# Patient Record
Sex: Female | Born: 1995 | Race: Black or African American | Hispanic: No | Marital: Single | Smoking: Never smoker
Health system: Southern US, Community
[De-identification: ages and names within clinical notes are randomized; demographics above are authoritative.]

## PROBLEM LIST (undated history)

## (undated) DIAGNOSIS — J45909 Unspecified asthma, uncomplicated: Secondary | ICD-10-CM

---
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
## 2017-03-21 ENCOUNTER — Encounter (HOSPITAL_COMMUNITY): Payer: Self-pay | Admitting: Emergency Medicine

## 2017-03-21 ENCOUNTER — Other Ambulatory Visit: Payer: Self-pay

## 2017-03-21 ENCOUNTER — Emergency Department (HOSPITAL_COMMUNITY)
Admission: EM | Admit: 2017-03-21 | Discharge: 2017-03-21 | Disposition: A | Discharge status: Home / Self Care | Payer: Self-pay | Attending: Physician Assistant | Admitting: Physician Assistant

## 2017-03-21 DIAGNOSIS — M79652 Pain in left thigh: Secondary | ICD-10-CM | POA: Insufficient documentation

## 2017-03-21 DIAGNOSIS — R51 Headache: Secondary | ICD-10-CM | POA: Insufficient documentation

## 2017-03-21 DIAGNOSIS — J45909 Unspecified asthma, uncomplicated: Secondary | ICD-10-CM | POA: Insufficient documentation

## 2017-03-21 MED ORDER — METHOCARBAMOL 500 MG PO TABS: 500.0000 mg | ORAL_TABLET | Freq: Every evening | ORAL | 0 refills | Status: DC | PRN

## 2017-03-21 MED ORDER — NAPROXEN 500 MG PO TABS: 500.0000 mg | ORAL_TABLET | Freq: Two times a day (BID) | ORAL | 0 refills | Status: DC
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
## 2019-02-28 ENCOUNTER — Encounter (HOSPITAL_COMMUNITY): Payer: Self-pay

## 2019-02-28 ENCOUNTER — Emergency Department (HOSPITAL_COMMUNITY)
Admission: EM | Admit: 2019-02-28 | Discharge: 2019-02-28 | Disposition: A | Discharge status: Home / Self Care | Payer: Self-pay | Attending: Emergency Medicine | Admitting: Emergency Medicine

## 2019-02-28 ENCOUNTER — Other Ambulatory Visit: Payer: Self-pay

## 2019-02-28 ENCOUNTER — Emergency Department (HOSPITAL_COMMUNITY): Payer: Self-pay

## 2019-02-28 DIAGNOSIS — Z791 Long term (current) use of non-steroidal anti-inflammatories (NSAID): Secondary | ICD-10-CM | POA: Insufficient documentation

## 2019-02-28 DIAGNOSIS — J45909 Unspecified asthma, uncomplicated: Secondary | ICD-10-CM | POA: Insufficient documentation

## 2019-02-28 DIAGNOSIS — Z793 Long term (current) use of hormonal contraceptives: Secondary | ICD-10-CM | POA: Insufficient documentation

## 2019-02-28 DIAGNOSIS — Z20822 Contact with and (suspected) exposure to covid-19: Secondary | ICD-10-CM

## 2019-02-28 DIAGNOSIS — R509 Fever, unspecified: Secondary | ICD-10-CM | POA: Insufficient documentation

## 2019-02-28 DIAGNOSIS — Z79899 Other long term (current) drug therapy: Secondary | ICD-10-CM | POA: Insufficient documentation

## 2019-02-28 DIAGNOSIS — Z20828 Contact with and (suspected) exposure to other viral communicable diseases: Secondary | ICD-10-CM | POA: Insufficient documentation

## 2019-02-28 LAB — COMPREHENSIVE METABOLIC PANEL
ALT: 67 U/L — ABNORMAL HIGH (ref 0–44)
AST: 132 U/L — ABNORMAL HIGH (ref 15–41)
Albumin: 3.8 g/dL (ref 3.5–5.0)
Alkaline Phosphatase: 88 U/L (ref 38–126)
Anion gap: 10 (ref 5–15)
BUN: 10 mg/dL (ref 6–20)
CO2: 23 mmol/L (ref 22–32)
Calcium: 8.3 mg/dL — ABNORMAL LOW (ref 8.9–10.3)
Chloride: 97 mmol/L — ABNORMAL LOW (ref 98–111)
Creatinine, Ser: 0.73 mg/dL (ref 0.44–1.00)
GFR calc Af Amer: >60 mL/min (ref >60)
GFR calc non Af Amer: >60 mL/min (ref >60)
Glucose, Bld: 111 mg/dL — ABNORMAL HIGH (ref 70–99)
Potassium: 4 mmol/L (ref 3.5–5.1)
Sodium: 130 mmol/L — ABNORMAL LOW (ref 135–145)
Total Bilirubin: 1.5 mg/dL — ABNORMAL HIGH (ref 0.3–1.2)
Total Protein: 7.7 g/dL (ref 6.5–8.1)

## 2019-02-28 LAB — CBC WITH DIFFERENTIAL/PLATELET
Abs Immature Granulocytes: 0.02 10*3/uL (ref 0.00–0.07)
Basophils Absolute: 0.1 10*3/uL (ref 0.0–0.1)
Basophils Relative: 1 %
Eosinophils Absolute: 0 10*3/uL (ref 0.0–0.5)
Eosinophils Relative: 0 %
HCT: 41.6 % (ref 36.0–46.0)
Hemoglobin: 13.6 g/dL (ref 12.0–15.0)
Immature Granulocytes: 0 %
Lymphocytes Relative: 17 %
Lymphs Abs: 1.1 10*3/uL (ref 0.7–4.0)
MCH: 29.4 pg (ref 26.0–34.0)
MCHC: 32.7 g/dL (ref 30.0–36.0)
MCV: 89.8 fL (ref 80.0–100.0)
Monocytes Absolute: 0.5 10*3/uL (ref 0.1–1.0)
Monocytes Relative: 7 %
Neutro Abs: 4.9 10*3/uL (ref 1.7–7.7)
Neutrophils Relative %: 75 %
Platelets: 213 10*3/uL (ref 150–400)
RBC: 4.63 MIL/uL (ref 3.87–5.11)
RDW: 13.9 % (ref 11.5–15.5)
WBC: 6.6 10*3/uL (ref 4.0–10.5)
nRBC: 0 % (ref 0.0–0.2)

## 2019-02-28 LAB — URINALYSIS, ROUTINE W REFLEX MICROSCOPIC
Bilirubin Urine: NEGATIVE
Glucose, UA: NEGATIVE mg/dL
Hgb urine dipstick: NEGATIVE
Ketones, ur: NEGATIVE mg/dL
Nitrite: NEGATIVE
Protein, ur: 30 mg/dL — AB
Specific Gravity, Urine: 1.024 (ref 1.005–1.030)
pH: 5 (ref 5.0–8.0)

## 2019-02-28 MED ORDER — ONDANSETRON 4 MG PO TBDP: 4.0000 mg | ORAL_TABLET | Freq: Three times a day (TID) | ORAL | 0 refills | Status: DC | PRN

## 2019-02-28 MED ORDER — CEPHALEXIN 500 MG PO CAPS: 500.0000 mg | ORAL_CAPSULE | Freq: Three times a day (TID) | ORAL | 0 refills | Status: DC

## 2019-02-28 MED ORDER — ACETAMINOPHEN 500 MG PO TABS
1000.0000 mg | ORAL_TABLET | Freq: Once | ORAL | Status: AC
  Administered 2019-02-28: 1000 mg via ORAL
  Filled 2019-02-28: qty 2

## 2019-02-28 MED ORDER — CEPHALEXIN 500 MG PO CAPS
500.0000 mg | ORAL_CAPSULE | Freq: Once | ORAL | Status: DC
  Filled 2019-02-28: qty 1

## 2019-02-28 NOTE — ED Triage Notes (Signed)
Pt reports a headache (she describes it as feeling like a hangover) and fatigue x4 days. Denies any sick contacts. States that headache is worse when she looks up.

## 2019-02-28 NOTE — ED Provider Notes (Signed)
[facility]-EMERGENCY DEPT Provider Note  CSN: [id number] Arrival date & time: 02/28/19 2458  Chief Complaint(s) Chills  HPI Daisy Goodman is a 23 y.o. female who presents to the emergency department with generalized fatigue, generalized headache, neck pain.  Patient reports that she has had generalized mild headache for 3 to 4 days which has improved with over-the-counter migraine headache.  Patient also reported right-sided neck pain worse with range of motion and looking up.  She denies any nuchal rigidity.  Reports pain improvement with the heat pack.  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  Patient actually presented today after waking up at 2 AM with chills and shaking.  She did not realize she had a fever.  She denies any nausea vomiting.  No chest pain or shortness of breath.  No coughing or congestion.  No abdominal pain.  No diarrhea.  No urinary symptoms.  The history is provided by the patient.    Past Medical History Past Medical History:  Diagnosis Date  . Asthma    Patient Active Problem List   Diagnosis Date Noted  . Rhinitis, allergic 02/26/2015  . Dysmenorrhea 02/26/2015  . Severe obesity (BMI >= 40) (HCC) 09/17/2013  . Asthma, chronic 09/17/2013   Home Medication(s) Prior to Admission medications   Medication Sig Start Date End Date Taking? Authorizing Provider  albuterol (PROVENTIL HFA;VENTOLIN HFA) 108 (90 Base) MCG/ACT inhaler Inhale 2 puffs into the lungs every 6 (six) hours as needed for wheezing. Use with spacer 11/27/15   Kalman Jewels, MD  cephALEXin (KEFLEX) 500 MG capsule Take 1 capsule (500 mg total) by mouth 3 (three) times daily for 5 days. 02/28/19 03/05/19  Nira Conn, MD  cetirizine (ZYRTEC) 10 MG tablet Take 1 tablet (10 mg total) by mouth daily. As needed for allergy symptoms 11/27/15   Kalman Jewels, MD    Etonogestrel [facility]) Inject into the skin.    [provider]  methocarbamol (ROBAXIN) 500 MG tablet Take 1 tablet (500 mg total) by mouth at bedtime as needed for muscle spasms. 03/21/17   Mathews Robinsons B, PA-C  naproxen (NAPROSYN) 500 MG tablet Take 1 tablet (500 mg total) by mouth 2 (two) times daily with a meal. 03/21/17   Mathews Robinsons B, PA-C  ondansetron (ZOFRAN ODT) 4 MG disintegrating tablet Take 1 tablet (4 mg total) by mouth every 8 (eight) hours as needed for up to 3 days for nausea or vomiting. 02/28/19 03/03/19  Rosaleen Mazer, Amadeo Garnet, MD                                                                                                                                    Past Surgical History History reviewed. No pertinent surgical history. Family History Family History  Problem Relation Age of Onset  .  Hypertension Other   . Heart attack Other     Social History Social History   Tobacco Use  . Smoking status: Never Smoker  . Smokeless tobacco: Never Used  Substance Use Topics  . Alcohol use: No  . Drug use: No   Allergies Patient has no known allergies.  Review of Systems Review of Systems All other systems are reviewed and are negative for acute change except as noted in the HPI  Physical Exam Vital Signs  I have reviewed the triage vital signs BP 108/62 (BP Location: Left Arm)   Pulse (!) 123   Temp (!) 103 F (39.4 C) (Oral)   Resp 18   Ht 6' (1.829 m)   Wt 136.1 kg   SpO2 98%   BMI 40.69 kg/m   Physical Exam Vitals reviewed.  Constitutional:      General: She is not in acute distress.    Appearance: She is well-developed. She is obese. She is not diaphoretic.  HENT:     Head: Normocephalic and atraumatic.     Nose: Nose normal.  Eyes:     General: No scleral icterus.       Right eye: No discharge.        Left eye: No discharge.     Conjunctiva/sclera: Conjunctivae normal.     Pupils: Pupils are equal, round, and reactive  to light.  Neck:     Meningeal: Brudzinski's sign and Kernig's sign absent.   Cardiovascular:     Rate and Rhythm: Normal rate and regular rhythm.     Heart sounds: No murmur. No friction rub. No gallop.   Pulmonary:     Effort: Pulmonary effort is normal. No respiratory distress.     Breath sounds: Normal breath sounds. No stridor.  Abdominal:     General: There is no distension.     Palpations: Abdomen is soft.     Tenderness: There is no abdominal tenderness.  Musculoskeletal:     Cervical back: Normal range of motion and neck supple. Tenderness present. Muscular tenderness present. No spinous process tenderness.     Thoracic back: Tenderness present.       Back:  Skin:    General: Skin is warm and dry.     Findings: No erythema or rash.  Neurological:     Mental Status: She is alert and oriented to person, place, and time.     ED Results and Treatments Labs (all labs ordered are listed, but only abnormal results are displayed) Labs Reviewed  COMPREHENSIVE METABOLIC PANEL - Abnormal; Notable for the following components:      Result Value   Sodium 130 (*)    Chloride 97 (*)    Glucose, Bld 111 (*)    Calcium 8.3 (*)    AST 132 (*)    ALT 67 (*)    Total Bilirubin 1.5 (*)    All other components within normal limits  URINALYSIS, ROUTINE W REFLEX MICROSCOPIC - Abnormal; Notable for the following components:   Color, Urine AMBER (*)    APPearance HAZY (*)    Protein, ur 30 (*)    Leukocytes,Ua MODERATE (*)    Bacteria, UA FEW (*)    All other components within normal limits  NOVEL CORONAVIRUS, NAA (HOSP ORDER, SEND-OUT TO REF LAB; TAT 18-24 HRS)  URINE CULTURE  CBC WITH DIFFERENTIAL/PLATELET  EKG  EKG Interpretation  Date/Time:    Ventricular Rate:    PR Interval:    QRS Duration:   QT Interval:    QTC Calculation:   R Axis:     Text  Interpretation:        Radiology CXR Port 1 View - chest pain  Result Date: 02/28/2019 CLINICAL DATA:  Fever and body aches. EXAM: PORTABLE CHEST 1 VIEW COMPARISON:  None. FINDINGS: The cardiomediastinal contours are normal. Upper normal heart size, particularly for age. Minimal streaky bibasilar opacities. Pulmonary vasculature is normal. No pleural effusion or pneumothorax. No acute osseous abnormalities are seen. Soft tissue attenuation from habitus limits assessment. IMPRESSION: 1. Minimal streaky bibasilar opacities, favor atelectasis over pneumonia 2. Upper normal heart size, particularly for age. Electronically Signed   By: Keith Rake M.D.   On: 02/28/2019 04:55    Pertinent labs & imaging results that were available during my care of the patient were reviewed by me and considered in my medical decision making (see chart for details).  Medications Ordered in ED Medications  cephALEXin (KEFLEX) capsule 500 mg (has no administration in time range)  acetaminophen (TYLENOL) tablet 1,000 mg (1,000 mg Oral Given 02/28/19 0421)                                                                                                                                    Procedures Procedures  (including critical care time)  Medical Decision Making / ED Course I have reviewed the nursing notes for this encounter and the patient's prior records (if available in EHR or on provided paperwork).   Daisy Goodman was evaluated in Emergency Department on 02/28/2019 for the symptoms described in the history of present illness. She was evaluated in the context of the global COVID-19 pandemic, which necessitated consideration that the patient might be at risk for infection with the SARS-CoV-2 virus that causes COVID-19. Institutional protocols and algorithms that pertain to the evaluation of patients at risk for COVID-19 are in a state of rapid change based on information released by regulatory bodies  including the CDC and federal and state organizations. These policies and algorithms were followed during the patient's care in the ED.  Patient presents with headache and myalgia for 3 days. adequate oral hydration. Rest of history as above.  Patient appears well. No signs of toxicity, patient is interactive. No hypoxia, tachypnea or other signs of respiratory distress. No sign of clinical dehydration.  Rest of exam as above.  CXR w/o PNA. Labs with mild transaminitis - if COVID + likely related to this.  UA with possible infection. Culture sent. Short course of Abx  No evidence suggestive of pharyngitis, AOM, PNA, or meningitis.    Discussed symptomatic treatment with the patient and they will follow closely with their PCP.        Final Clinical Impression(s) / ED Diagnoses Final diagnoses:  Fever  Suspected  COVID-19 virus infection    The patient appears reasonably screened and/or stabilized for discharge and I doubt any other medical condition or other EMC requiring further screening, evaluation, or treatment in the ED at this [facility].  Disposition: Discharge  Condition: Good  I have discussed the results, Dx and Tx plan with the patient who expressed understanding and agree(s) with the plan. Discharge instructions discussed at great length. The patient was given strict return precautions who verbalized understanding of the instructions. No further questions at time of discharge.    ED Discharge Orders         Ordered    cephALEXin (KEFLEX) 500 MG capsule  3 times daily     02/28/19 0727    ondansetron (ZOFRAN ODT) 4 MG disintegrating tablet  Every 8 hours PRN     02/28/19 0728           Follow Up: Norm SaltVanstory, Ashley N, PA [address] [place] [address] [phone]  Call  As needed      This chart was dictated using voice recognition software.  Despite best efforts to proofread,  errors can occur which can change the  documentation meaning.   Nira Connardama, Adelise Buswell Eduardo, MD 02/28/19 [phone]

## 2019-03-01 LAB — NOVEL CORONAVIRUS, NAA (HOSP ORDER, SEND-OUT TO REF LAB; TAT 18-24 HRS): SARS-CoV-2, NAA: NOT DETECTED

## 2019-03-01 LAB — URINE CULTURE

## 2019-03-02 ENCOUNTER — Emergency Department (HOSPITAL_COMMUNITY): Payer: Self-pay

## 2019-03-02 ENCOUNTER — Other Ambulatory Visit: Payer: Self-pay

## 2019-03-02 ENCOUNTER — Encounter (HOSPITAL_COMMUNITY): Payer: Self-pay

## 2019-03-02 ENCOUNTER — Inpatient Hospital Stay (HOSPITAL_COMMUNITY)
Admission: EM | Admit: 2019-03-02 | Discharge: 2019-03-09 | DRG: 866 | Disposition: A | Discharge status: Home / Self Care | Payer: Self-pay | Attending: Internal Medicine | Admitting: Internal Medicine

## 2019-03-02 DIAGNOSIS — B349 Viral infection, unspecified: Principal | ICD-10-CM | POA: Diagnosis present

## 2019-03-02 DIAGNOSIS — Z20828 Contact with and (suspected) exposure to other viral communicable diseases: Secondary | ICD-10-CM | POA: Diagnosis present

## 2019-03-02 DIAGNOSIS — R519 Headache, unspecified: Secondary | ICD-10-CM | POA: Diagnosis present

## 2019-03-02 DIAGNOSIS — K760 Fatty (change of) liver, not elsewhere classified: Secondary | ICD-10-CM | POA: Diagnosis present

## 2019-03-02 DIAGNOSIS — R748 Abnormal levels of other serum enzymes: Secondary | ICD-10-CM | POA: Diagnosis present

## 2019-03-02 DIAGNOSIS — R112 Nausea with vomiting, unspecified: Secondary | ICD-10-CM

## 2019-03-02 DIAGNOSIS — R509 Fever, unspecified: Secondary | ICD-10-CM | POA: Diagnosis present

## 2019-03-02 DIAGNOSIS — R7401 Elevation of levels of liver transaminase levels: Secondary | ICD-10-CM

## 2019-03-02 DIAGNOSIS — N390 Urinary tract infection, site not specified: Secondary | ICD-10-CM | POA: Diagnosis present

## 2019-03-02 DIAGNOSIS — Z6841 Body Mass Index (BMI) 40.0 and over, adult: Secondary | ICD-10-CM

## 2019-03-02 DIAGNOSIS — G971 Other reaction to spinal and lumbar puncture: Secondary | ICD-10-CM | POA: Diagnosis not present

## 2019-03-02 LAB — CBC WITH DIFFERENTIAL/PLATELET
Abs Immature Granulocytes: 0.03 10*3/uL (ref 0.00–0.07)
Basophils Absolute: 0.1 10*3/uL (ref 0.0–0.1)
Basophils Relative: 1 %
Eosinophils Absolute: 0 10*3/uL (ref 0.0–0.5)
Eosinophils Relative: 0 %
HCT: 40.9 % (ref 36.0–46.0)
Hemoglobin: 13 g/dL (ref 12.0–15.0)
Immature Granulocytes: 1 %
Lymphocytes Relative: 34 %
Lymphs Abs: 2 10*3/uL (ref 0.7–4.0)
MCH: 28.6 pg (ref 26.0–34.0)
MCHC: 31.8 g/dL (ref 30.0–36.0)
MCV: 89.9 fL (ref 80.0–100.0)
Monocytes Absolute: 0.4 10*3/uL (ref 0.1–1.0)
Monocytes Relative: 7 %
Neutro Abs: 3.4 10*3/uL (ref 1.7–7.7)
Neutrophils Relative %: 57 %
Platelets: 181 10*3/uL (ref 150–400)
RBC: 4.55 MIL/uL (ref 3.87–5.11)
RDW: 14.3 % (ref 11.5–15.5)
WBC: 5.8 10*3/uL (ref 4.0–10.5)
nRBC: 0 % (ref 0.0–0.2)

## 2019-03-02 LAB — URINALYSIS, ROUTINE W REFLEX MICROSCOPIC
Glucose, UA: NEGATIVE mg/dL
Hgb urine dipstick: NEGATIVE
Ketones, ur: NEGATIVE mg/dL
Nitrite: NEGATIVE
Protein, ur: 100 mg/dL — AB
Specific Gravity, Urine: 1.026 (ref 1.005–1.030)
pH: 5 (ref 5.0–8.0)

## 2019-03-02 LAB — COMPREHENSIVE METABOLIC PANEL
ALT: 178 U/L — ABNORMAL HIGH (ref 0–44)
AST: 250 U/L — ABNORMAL HIGH (ref 15–41)
Albumin: 3.4 g/dL — ABNORMAL LOW (ref 3.5–5.0)
Alkaline Phosphatase: 213 U/L — ABNORMAL HIGH (ref 38–126)
Anion gap: 11 (ref 5–15)
BUN: 5 mg/dL — ABNORMAL LOW (ref 6–20)
CO2: 23 mmol/L (ref 22–32)
Calcium: 8.7 mg/dL — ABNORMAL LOW (ref 8.9–10.3)
Chloride: 99 mmol/L (ref 98–111)
Creatinine, Ser: 0.7 mg/dL (ref 0.44–1.00)
GFR calc Af Amer: >60 mL/min (ref >60)
GFR calc non Af Amer: >60 mL/min (ref >60)
Glucose, Bld: 105 mg/dL — ABNORMAL HIGH (ref 70–99)
Potassium: 3.6 mmol/L (ref 3.5–5.1)
Sodium: 133 mmol/L — ABNORMAL LOW (ref 135–145)
Total Bilirubin: 4.3 mg/dL — ABNORMAL HIGH (ref 0.3–1.2)
Total Protein: 7.7 g/dL (ref 6.5–8.1)

## 2019-03-02 LAB — RESPIRATORY PANEL BY RT PCR (FLU A&B, COVID)
Influenza A by PCR: NEGATIVE
Influenza B by PCR: NEGATIVE
SARS Coronavirus 2 by RT PCR: NEGATIVE

## 2019-03-02 LAB — LIPASE, BLOOD: Lipase: 30 U/L (ref 11–51)

## 2019-03-02 LAB — LACTIC ACID, PLASMA
Lactic Acid, Venous: 1.1 mmol/L (ref 0.5–1.9)
Lactic Acid, Venous: 0.9 mmol/L (ref 0.5–1.9)

## 2019-03-02 LAB — PREGNANCY, URINE: Preg Test, Ur: NEGATIVE

## 2019-03-02 LAB — ACETAMINOPHEN LEVEL: Acetaminophen (Tylenol), Serum: 15 ug/mL (ref 10–30)

## 2019-03-02 LAB — MONONUCLEOSIS SCREEN: Mono Screen: NEGATIVE

## 2019-03-02 MED ORDER — LIDOCAINE HCL 2 % IJ SOLN
10.0000 mL | Freq: Once | INTRAMUSCULAR | Status: AC
  Administered 2019-03-02: 200 mg via INTRADERMAL
  Filled 2019-03-02: qty 20

## 2019-03-02 MED ORDER — ONDANSETRON HCL 4 MG/2ML IJ SOLN
4.0000 mg | Freq: Once | INTRAMUSCULAR | Status: AC
  Administered 2019-03-02: 4 mg via INTRAVENOUS
  Filled 2019-03-02: qty 2

## 2019-03-02 MED ORDER — IOHEXOL 300 MG/ML  SOLN
100.0000 mL | Freq: Once | INTRAMUSCULAR | Status: AC | PRN
  Administered 2019-03-02: 100 mL via INTRAVENOUS

## 2019-03-02 MED ORDER — VANCOMYCIN HCL IN DEXTROSE 1-5 GM/200ML-% IV SOLN
1000.0000 mg | Freq: Once | INTRAVENOUS | Status: AC
  Administered 2019-03-02: 1000 mg via INTRAVENOUS
  Filled 2019-03-02: qty 200

## 2019-03-02 MED ORDER — MORPHINE SULFATE (PF) 4 MG/ML IV SOLN
4.0000 mg | Freq: Once | INTRAVENOUS | Status: AC
  Administered 2019-03-02: 4 mg via INTRAVENOUS
  Filled 2019-03-02: qty 1

## 2019-03-02 MED ORDER — SODIUM CHLORIDE (PF) 0.9 % IJ SOLN
INTRAMUSCULAR | Status: AC
  Filled 2019-03-02: qty 50

## 2019-03-02 MED ORDER — SODIUM CHLORIDE 0.9 % IV BOLUS
1000.0000 mL | Freq: Once | INTRAVENOUS | Status: AC
  Administered 2019-03-02: 1000 mL via INTRAVENOUS

## 2019-03-02 MED ORDER — ACETAMINOPHEN 500 MG PO TABS
1000.0000 mg | ORAL_TABLET | Freq: Once | ORAL | Status: AC
  Administered 2019-03-02: 19:00:00 1000 mg via ORAL
  Filled 2019-03-02: qty 2

## 2019-03-02 MED ORDER — SODIUM CHLORIDE 0.9 % IV SOLN
2.0000 g | Freq: Once | INTRAVENOUS | Status: AC
  Administered 2019-03-02: 2 g via INTRAVENOUS
  Filled 2019-03-02: qty 20

## 2019-03-02 NOTE — ED Triage Notes (Signed)
Pt reports she has had a headache for about a week now. Pt was seen here 2 days ago for same. Pt was diagnosed with a UTI and given antibiotics. Pt is febrile in triage. Pt took tylenol around 2pm.

## 2019-03-02 NOTE — ED Provider Notes (Signed)
Medical screening examination/treatment/procedure(s) were conducted as a shared visit with non-physician practitioner(s) and myself.  I personally evaluated the patient during the encounter.    23 year old female who presents with mild frontal headache, neck pain x2 to 4 days.  Seen here for similar symptoms 2 days ago and had negative Covid test work-up at that time.  Denies any photophobia.  Some nausea but no vomiting.  On exam she complains of neck discomfort with movement of her neck to the sides.  No nuchal rigidity.  Neurological exam is stable.  Will check patient for flu as well as Covid again.  Have offered patient LP which she is considering.   Lacretia Leigh, MD 03/02/19 [phone]

## 2019-03-02 NOTE — ED Provider Notes (Signed)
[facility] DEPT Provider Note   CSN: [id number] Arrival date & time: 03/02/19  1658     History Chief Complaint  Patient presents with  . Fever  . Headache    Daisy Goodman is a 23 y.o. female.  The history is provided by the patient. No language interpreter was used.  Fever Associated symptoms: headaches   Headache Associated symptoms: fever        23 year old female with history of asthma presenting complaining of cold symptoms.  For the past week patient has had headache, fever, generalized fatigue, not feeling well.  She does not complain of any runny nose sneezing or coughing no nausea vomiting diarrhea no light sound sensitivity and no dysuria.  She does complain of body aches and describes her headache as an uncomfortable sensation in her head but does not feel like pain.  She was seen in the ED 2 days ago for her complaint.  Was felt the patient is not likely having meningitis and her chest x-ray did not show any signs of pneumonia at that time her labs did show some mild transaminitis, UA with possible infection and therefore patient was sent home with Keflex as well as Tylenol and rest.  Patient mention she has been taking medication but felt symptoms not improving prompting this ER visit.  Past Medical History:  Diagnosis Date  . Asthma     Patient Active Problem List   Diagnosis Date Noted  . Rhinitis, allergic 02/26/2015  . Dysmenorrhea 02/26/2015  . Severe obesity (BMI >= 40) ([place]) 09/17/2013  . Asthma, chronic 09/17/2013    History reviewed. No pertinent surgical history.   OB History   No obstetric history on file.     Family History  Problem Relation Age of Onset  . Hypertension Other   . Heart attack Other     Social History   Tobacco Use  . Smoking status: Never Smoker  . Smokeless tobacco: Never Used  Substance Use Topics  . Alcohol use: No  . Drug use: No    Home Medications Prior to Admission  medications   Medication Sig Start Date End Date Taking? Authorizing Provider  albuterol (PROVENTIL HFA;VENTOLIN HFA) 108 (90 Base) MCG/ACT inhaler Inhale 2 puffs into the lungs every 6 (six) hours as needed for wheezing. Use with spacer 11/27/15   Rae Lips, MD  cephALEXin (KEFLEX) 500 MG capsule Take 1 capsule (500 mg total) by mouth 3 (three) times daily for 5 days. 02/28/19 03/05/19  Fatima Blank, MD  cetirizine (ZYRTEC) 10 MG tablet Take 1 tablet (10 mg total) by mouth daily. As needed for allergy symptoms 11/27/15   Rae Lips, MD  Etonogestrel [facility]) Inject into the skin.    [provider]  methocarbamol (ROBAXIN) 500 MG tablet Take 1 tablet (500 mg total) by mouth at bedtime as needed for muscle spasms. 03/21/17   Avie Echevaria B, PA-C  naproxen (NAPROSYN) 500 MG tablet Take 1 tablet (500 mg total) by mouth 2 (two) times daily with a meal. 03/21/17   Avie Echevaria B, PA-C  ondansetron (ZOFRAN ODT) 4 MG disintegrating tablet Take 1 tablet (4 mg total) by mouth every 8 (eight) hours as needed for up to 3 days for nausea or vomiting. 02/28/19 03/03/19  Cardama, Grayce Sessions, MD    Allergies    Patient has no known allergies.  Review of Systems   Review of Systems  Constitutional: Positive for fever.  Neurological: Positive for headaches.  All other systems reviewed and are negative.   Physical Exam Updated Vital Signs BP (!) 155/81 (BP Location: Right Arm)   Pulse (!) 125   Temp (!) 103.1 F (39.5 C) (Oral)   Resp 18   Ht 6' (1.829 m)   Wt 136 kg   LMP  (LMP Unknown)   SpO2 99%   BMI 40.66 kg/m   Physical Exam Vitals and nursing note reviewed.  Constitutional:      General: She is not in acute distress.    Appearance: She is well-developed.  HENT:     Head: Normocephalic and atraumatic.     Mouth/Throat:     Mouth: Mucous membranes are moist.  Eyes:     Extraocular Movements: Extraocular movements intact.      Conjunctiva/sclera: Conjunctivae normal.     Pupils: Pupils are equal, round, and reactive to light.  Cardiovascular:     Rate and Rhythm: Tachycardia present.     Heart sounds: Normal heart sounds.  Pulmonary:     Breath sounds: No wheezing, rhonchi or rales.  Abdominal:     Palpations: Abdomen is soft.     Tenderness: There is no abdominal tenderness.  Musculoskeletal:        General: Normal range of motion.     Cervical back: Neck supple. No rigidity.  Skin:    Findings: No rash.  Neurological:     Mental Status: She is alert and oriented to person, place, and time.     GCS: GCS eye subscore is 4. GCS verbal subscore is 5. GCS motor subscore is 6.     Cranial Nerves: No cranial nerve deficit.  Psychiatric:        Mood and Affect: Mood normal.     ED Results / Procedures / Treatments   Labs (all labs ordered are listed, but only abnormal results are displayed) Labs Reviewed  COMPREHENSIVE METABOLIC PANEL - Abnormal; Notable for the following components:      Result Value   Sodium 133 (*)    Glucose, Bld 105 (*)    BUN 5 (*)    Calcium 8.7 (*)    Albumin 3.4 (*)    AST 250 (*)    ALT 178 (*)    Alkaline Phosphatase 213 (*)    Total Bilirubin 4.3 (*)    All other components within normal limits  URINALYSIS, ROUTINE W REFLEX MICROSCOPIC - Abnormal; Notable for the following components:   Color, Urine AMBER (*)    Bilirubin Urine SMALL (*)    Protein, ur 100 (*)    Leukocytes,Ua MODERATE (*)    Bacteria, UA FEW (*)    All other components within normal limits  RESPIRATORY PANEL BY RT PCR (FLU A&B, COVID)  URINE CULTURE  CULTURE, BLOOD (ROUTINE X 2)  CULTURE, BLOOD (ROUTINE X 2)  CSF CULTURE  GRAM STAIN  CBC WITH DIFFERENTIAL/PLATELET  LIPASE, BLOOD  PREGNANCY, URINE  LACTIC ACID, PLASMA  LACTIC ACID, PLASMA  MONONUCLEOSIS SCREEN  ACETAMINOPHEN LEVEL  CSF CELL COUNT WITH DIFFERENTIAL  CSF CELL COUNT WITH DIFFERENTIAL  GLUCOSE, CSF  PROTEIN, CSF     EKG None  Radiology CT Head Wo Contrast  Result Date: 03/02/2019 CLINICAL DATA:  Headache EXAM: CT HEAD WITHOUT CONTRAST TECHNIQUE: Contiguous axial images were obtained from the base of the skull through the vertex without intravenous contrast. COMPARISON:  None. FINDINGS: Brain: There is no mass, hemorrhage or extra-axial collection. The size and configuration of the ventricles and extra-axial CSF  spaces are normal. The brain parenchyma is normal, without acute or chronic infarction. Vascular: No abnormal hyperdensity of the major intracranial arteries or dural venous sinuses. No intracranial atherosclerosis. Skull: The visualized skull base, calvarium and extracranial soft tissues are normal. Sinuses/Orbits: No fluid levels or advanced mucosal thickening of the visualized paranasal sinuses. No mastoid or middle ear effusion. The orbits are normal. IMPRESSION: Normal head CT. Electronically Signed   By: Ulyses Jarred M.D.   On: 03/02/2019 19:16   DG Chest Portable 1 View  Result Date: 03/02/2019 CLINICAL DATA:  Fever and headache EXAM: PORTABLE CHEST 1 VIEW COMPARISON:  February 28, 2019 FINDINGS: The heart size and mediastinal contours are mildly prominent as on prior exam. Both lungs are clear. The visualized skeletal structures are unremarkable. IMPRESSION: No active disease. Electronically Signed   By: Prudencio Pair M.D.   On: 03/02/2019 18:29    Procedures .Lumbar Puncture  Date/Time: 03/02/2019 9:43 PM Performed by: Domenic Moras, PA-C Authorized by: Domenic Moras, PA-C   Consent:    Consent obtained:  Verbal and written   Consent given by:  Patient   Risks discussed:  Bleeding, infection and repeat procedure   Alternatives discussed:  No treatment and referral Pre-procedure details:    Procedure purpose:  Diagnostic   Preparation: Patient was prepped and draped in usual sterile fashion   Anesthesia (see MAR for exact dosages):    Anesthesia method:  Local infiltration    Local anesthetic:  Lidocaine 2% w/o epi Procedure details:    Lumbar space:  L3-L4 interspace   Patient position:  Sitting   Needle gauge:  22   Needle type:  Spinal needle - Quincke tip   Needle length (in):  3.5   Ultrasound guidance: no     Number of attempts:  2 Comments:     Unsuccessful attempt.   .Critical Care Performed by: Domenic Moras, PA-C Authorized by: Domenic Moras, PA-C   Critical care provider statement:    Critical care time (minutes):  45   Critical care was time spent personally by me on the following activities:  Discussions with consultants, evaluation of patient's response to treatment, examination of patient, ordering and performing treatments and interventions, ordering and review of laboratory studies, ordering and review of radiographic studies, pulse oximetry, re-evaluation of patient's condition, obtaining history from patient or surrogate and review of old charts   (including critical care time)  Medications Ordered in ED Medications  vancomycin (VANCOCIN) IVPB 1000 mg/200 mL premix (has no administration in time range)  sodium chloride (PF) 0.9 % injection (has no administration in time range)  ondansetron (ZOFRAN) injection 4 mg (4 mg Intravenous Given 03/02/19 1831)  morphine 4 MG/ML injection 4 mg (4 mg Intravenous Given 03/02/19 1832)  sodium chloride 0.9 % bolus 1,000 mL (0 mLs Intravenous Stopped 03/02/19 1944)  acetaminophen (TYLENOL) tablet 1,000 mg (1,000 mg Oral Given 03/02/19 1831)  lidocaine (XYLOCAINE) 2 % (with pres) injection 200 mg (200 mg Intradermal Given by Other 03/02/19 2135)  cefTRIAXone (ROCEPHIN) 2 g in sodium chloride 0.9 % 100 mL IVPB (0 g Intravenous Stopped 03/02/19 2305)  iohexol (OMNIPAQUE) 300 MG/ML solution 100 mL (100 mLs Intravenous Contrast Given 03/02/19 2345)    ED Course  I have reviewed the triage vital signs and the nursing notes.  Pertinent labs & imaging results that were available during my care of the patient  were reviewed by me and considered in my medical decision making (see chart for details).  MDM Rules/Calculators/A&P                      BP (!) 155/81 (BP Location: Right Arm)   Pulse (!) 125   Temp (!) 103.1 F (39.5 C) (Oral)   Resp 18   Ht 6' (1.829 m)   Wt 136 kg   LMP  (LMP Unknown)   SpO2 99%   BMI 40.66 kg/m   Final Clinical Impression(s) / ED Diagnoses Final diagnoses:  FUO (fever of unknown origin)  Bad headache  Acute lower UTI  Transaminitis    Rx / DC Orders ED Discharge Orders    None     5:57 PM Patient here with fever, body aches, headache.  No other symptom reported.  Patient was seen 2 days ago for the same complaint at that time examination, consistent with meningitis.  On today's exam, she does not have any nuchal rigidity, appears nontoxic however she is febrile with a temperature of 103.1 and tachycardic with heart rate of 125.  Will repeat labs.  6:16 PM I discussed care with Dr. Zenia Resides who has also evaluated patient.  Plan to obtain flu and mono test, IV fluid, obtain head CT scan, and will also give Tylenol for his symptoms.  If no improvement, may consider performing lumbar puncture to rule out meningitis.  8:15 PM Patient tested negative for COVID-19, influenza and mono.  She has normal lactic acid.  Worsening transaminitis with AST 224, ALT 178, Alk Phos 213, Total Bili 4.3.  Normal tylenol level.  No abd pain.  Attempted LP to r/o meningitis without success.  Will consult IR for IR guided LP.  Will obtain abd/pelvis CT.  In the mean time, pt given Rocephin and Vancomycin for suspect meningitis.    11:52 PM Multiple attempts were made to contact radiologist for lumbar puncture procedure without success.  Will continue to reach out to radiology.  Pt report improvement of her headache.  UA with evidence of UTI, pt denies urinary sxs.  Pt sign out to oncoming provider who will consult for admission pending result of LP.  Abd/pelvis CT for evaluation  of transaminitis is currently pending  On call radiologist Dr. Hassan Rowan [phone]) have been paged.  May need to repage if no contact in the next 30 min.     Domenic Moras, PA-C 03/03/19 0005    Lacretia Leigh, MD 03/04/19 [phone]

## 2019-03-03 ENCOUNTER — Inpatient Hospital Stay (HOSPITAL_COMMUNITY): Payer: Self-pay

## 2019-03-03 DIAGNOSIS — R748 Abnormal levels of other serum enzymes: Secondary | ICD-10-CM | POA: Diagnosis present

## 2019-03-03 DIAGNOSIS — R519 Headache, unspecified: Secondary | ICD-10-CM | POA: Diagnosis present

## 2019-03-03 DIAGNOSIS — R509 Fever, unspecified: Secondary | ICD-10-CM | POA: Diagnosis present

## 2019-03-03 LAB — CSF CELL COUNT WITH DIFFERENTIAL
RBC Count, CSF: 1000 /mm3 — ABNORMAL HIGH
RBC Count, CSF: 1000 /mm3 — ABNORMAL HIGH
Tube #: 1
Tube #: 4
WBC, CSF: 4 /mm3 (ref 0–5)
WBC, CSF: 4 /mm3 (ref 0–5)

## 2019-03-03 LAB — PROTEIN, CSF: Total  Protein, CSF: 24 mg/dL (ref 15–45)

## 2019-03-03 LAB — HEPATITIS B SURFACE ANTIGEN: Hepatitis B Surface Ag: NONREACTIVE

## 2019-03-03 LAB — COMPREHENSIVE METABOLIC PANEL
ALT: 165 U/L — ABNORMAL HIGH (ref 0–44)
AST: 225 U/L — ABNORMAL HIGH (ref 15–41)
Albumin: 3.1 g/dL — ABNORMAL LOW (ref 3.5–5.0)
Alkaline Phosphatase: 185 U/L — ABNORMAL HIGH (ref 38–126)
Anion gap: 8 (ref 5–15)
BUN: 6 mg/dL (ref 6–20)
CO2: 24 mmol/L (ref 22–32)
Calcium: 8.4 mg/dL — ABNORMAL LOW (ref 8.9–10.3)
Chloride: 103 mmol/L (ref 98–111)
Creatinine, Ser: 0.64 mg/dL (ref 0.44–1.00)
GFR calc Af Amer: >60 mL/min (ref >60)
GFR calc non Af Amer: >60 mL/min (ref >60)
Glucose, Bld: 99 mg/dL (ref 70–99)
Potassium: 3.7 mmol/L (ref 3.5–5.1)
Sodium: 135 mmol/L (ref 135–145)
Total Bilirubin: 3.8 mg/dL — ABNORMAL HIGH (ref 0.3–1.2)
Total Protein: 7 g/dL (ref 6.5–8.1)

## 2019-03-03 LAB — HEPATITIS C ANTIBODY: HCV Ab: NONREACTIVE

## 2019-03-03 LAB — CBC
HCT: 38.7 % (ref 36.0–46.0)
Hemoglobin: 12.3 g/dL (ref 12.0–15.0)
MCH: 29 pg (ref 26.0–34.0)
MCHC: 31.8 g/dL (ref 30.0–36.0)
MCV: 91.3 fL (ref 80.0–100.0)
Platelets: 147 10*3/uL — ABNORMAL LOW (ref 150–400)
RBC: 4.24 MIL/uL (ref 3.87–5.11)
RDW: 14.4 % (ref 11.5–15.5)
WBC: 6.3 10*3/uL (ref 4.0–10.5)
nRBC: 0 % (ref 0.0–0.2)

## 2019-03-03 LAB — HEPATITIS B CORE ANTIBODY, IGM: Hep B C IgM: NONREACTIVE

## 2019-03-03 LAB — HIV ANTIBODY (ROUTINE TESTING W REFLEX): HIV Screen 4th Generation wRfx: NONREACTIVE

## 2019-03-03 LAB — GLUCOSE, CSF: Glucose, CSF: 59 mg/dL (ref 40–70)

## 2019-03-03 MED ORDER — SODIUM CHLORIDE 0.9 % IV SOLN
2.0000 g | Freq: Two times a day (BID) | INTRAVENOUS | Status: DC
  Administered 2019-03-03 – 2019-03-04 (×3): 2 g via INTRAVENOUS
  Filled 2019-03-03: qty 20
  Filled 2019-03-03 (×3): qty 2

## 2019-03-03 MED ORDER — LIDOCAINE HCL 1 % IJ SOLN
INTRAMUSCULAR | Status: AC
  Filled 2019-03-03: qty 20

## 2019-03-03 MED ORDER — SODIUM CHLORIDE 0.9 % IV SOLN: INTRAVENOUS | Status: AC

## 2019-03-03 MED ORDER — VANCOMYCIN HCL IN DEXTROSE 1-5 GM/200ML-% IV SOLN
1000.0000 mg | Freq: Once | INTRAVENOUS | Status: AC
  Administered 2019-03-03: 1000 mg via INTRAVENOUS
  Filled 2019-03-03: qty 200

## 2019-03-03 MED ORDER — OXYCODONE-ACETAMINOPHEN 5-325 MG PO TABS
1.0000 | ORAL_TABLET | Freq: Once | ORAL | Status: AC
  Administered 2019-03-04: 05:00:00 1 via ORAL
  Filled 2019-03-03 (×2): qty 1

## 2019-03-03 MED ORDER — VANCOMYCIN HCL 1250 MG/250ML IV SOLN
1250.0000 mg | Freq: Three times a day (TID) | INTRAVENOUS | Status: DC
  Administered 2019-03-03 – 2019-03-04 (×3): 1250 mg via INTRAVENOUS
  Filled 2019-03-03 (×4): qty 250

## 2019-03-03 MED ORDER — SODIUM CHLORIDE 0.9 % IV SOLN: 2.0000 g | INTRAVENOUS | Status: DC

## 2019-03-03 MED ORDER — ACETAMINOPHEN 325 MG PO TABS
650.0000 mg | ORAL_TABLET | Freq: Four times a day (QID) | ORAL | Status: DC | PRN
  Administered 2019-03-03 – 2019-03-09 (×7): 650 mg via ORAL
  Filled 2019-03-03 (×8): qty 2

## 2019-03-03 MED ORDER — TRAMADOL HCL 50 MG PO TABS
50.0000 mg | ORAL_TABLET | Freq: Four times a day (QID) | ORAL | Status: DC | PRN
  Administered 2019-03-03 – 2019-03-04 (×3): 50 mg via ORAL
  Filled 2019-03-03 (×3): qty 1

## 2019-03-03 MED ORDER — ACETAMINOPHEN 650 MG RE SUPP: 650.0000 mg | Freq: Four times a day (QID) | RECTAL | Status: DC | PRN

## 2019-03-03 NOTE — Progress Notes (Signed)
Pharmacy Antibiotic Note  Daisy Goodman is a 23 y.o. female admitted on 03/02/2019 with meningitis.  Pharmacy has been consulted for Vancomycin dosing.  Plan: Vancomycin 1gm iv x1, then 2nd 1gm iv x1 Vancomycin 1250 mg IV Q 8 hrs.  Vancomycin trough goal 15-20 Not using AUC dosing in Meningitis  SCr used: 0.8 (adjusted)  Ceftriaxone 2gm iv q12hr   Height: 6' (182.9 cm) Weight: 299 lb 13.2 oz (136 kg) IBW/kg (Calculated) : 73.1  Temp (24hrs), Avg:101.5 F (38.6 C), Min:100.1 F (37.8 C), Max:103.1 F (39.5 C)  Recent Labs  Lab 02/28/19 0508 03/02/19 1750 03/02/19 1751 03/02/19 1951 03/03/19 0530  WBC 6.6 5.8  --   --  6.3  CREATININE 0.73 0.70  --   --   --   LATICACIDVEN  --   --  1.1 0.9  --     Estimated Creatinine Clearance: 172.6 mL/min (by C-G formula based on SCr of 0.7 mg/dL).    No Known Allergies  Antimicrobials this admission: Vancomycin 03/02/2019 >> Ceftriaxone 03/02/2019 >>   Dose adjustments this admission: -  Microbiology results: -  Thank you for allowing pharmacy to be a part of this patient's care.  Daisy Goodman 03/03/2019 6:07 AM

## 2019-03-03 NOTE — H&P (Signed)
History and Physical    SABLE KNOLES VWU:[id number] DOB: 06/30/1995 DOA: 03/02/2019  PCP: Norm Salt, PA (Confirm with patient/family/NH records and if not entered, this has to be entered at [facility] point of entry) Patient coming from: Home    Chief Complaint: Fever and headache  HPI: Daisy Goodman is a 23 y.o. female with medical history significant of asthma, dysmenorrhea and obesity presented to ED for evaluation of fever, headache and worsening generalized weakness.  Patient states that she started feeling sick since last week and the problem started with fever, chills and pressure on her head.  Patient further states that she came to the ED 2 days ago for same problem and she was diagnosed with UTI and discharged to home with antibiotics for UTI but her condition continue to worsen and now she is having severe neck discomfort with moving her neck.  Patient explains headache as a severe and constant discomfort sensation in the head that worsens with fever.  Patient otherwise denies photophobia, blurriness of vision, cough, loss of taste and smell sensation, chest pain, shortness of breath, nausea, vomiting, abdominal pain, urinary symptoms and anxiety.   ED Course: In ED on arrival patient had temperature of 103.1, heart rate 125, respiratory rate 18 and a blood pressure of 155 481 well oxygen saturation 99% on room air.  WBC count within normal limits, hemoglobin 13 elevated liver enzymes.  CT scan of her head was negative for acute intracranial bleed or pathology.  CT abdomen/pelvis was done and it shows fatty liver.  The ED patient was managed with IV vancomycin, IV ceftriaxone, morphine and Tylenol for suspected meningitis.  In the ED, lumbar puncture was tried but failed twice and interventional radiology is consulted by ED physician for lumbar puncture.  Review of Systems: As per HPI otherwise 10 point review of systems negative.    Past Medical History:  Diagnosis Date    Asthma     History reviewed. No pertinent surgical history.   reports that she has never smoked. She has never used smokeless tobacco. She reports that she does not drink alcohol or use drugs.  No Known Allergies  Family History  Problem Relation Age of Onset   Hypertension Other    Heart attack Other      Prior to Admission medications   Medication Sig Start Date End Date Taking? Authorizing Provider  acetaminophen (TYLENOL) 500 MG tablet Take 1,000 mg by mouth every 6 (six) hours as needed for mild pain, moderate pain, fever or headache.   Yes [provider]  cephALEXin (KEFLEX) 500 MG capsule Take 1 capsule (500 mg total) by mouth 3 (three) times daily for 5 days. 02/28/19 03/05/19 Yes Cardama, Amadeo Garnet, MD  Etonogestrel [facility]) Inject into the skin.   Yes [provider]  ondansetron (ZOFRAN ODT) 4 MG disintegrating tablet Take 1 tablet (4 mg total) by mouth every 8 (eight) hours as needed for up to 3 days for nausea or vomiting. 02/28/19 03/03/19 Yes Cardama, Amadeo Garnet, MD  albuterol (PROVENTIL HFA;VENTOLIN HFA) 108 (90 Base) MCG/ACT inhaler Inhale 2 puffs into the lungs every 6 (six) hours as needed for wheezing. Use with spacer Patient not taking: Reported on 03/03/2019 11/27/15   Kalman Jewels, MD  cetirizine (ZYRTEC) 10 MG tablet Take 1 tablet (10 mg total) by mouth daily. As needed for allergy symptoms Patient not taking: Reported on 03/03/2019 11/27/15   Kalman Jewels, MD    Physical Exam: Vitals:  03/02/19 2330 03/03/19 0030 03/03/19 0200 03/03/19 0438  BP: 109/64 124/86 111/85   Pulse: 93 85 100   Resp: 19 16 13    Temp:    (!) 101.3 F (38.5 C)  TempSrc:    Oral  SpO2: 100% 100% 99%   Weight:      Height:        Constitutional: NAD, calm, comfortable Vitals:   03/02/19 2330 03/03/19 0030 03/03/19 0200 03/03/19 0438  BP: 109/64 124/86 111/85   Pulse: 93 85 100   Resp: 19 16 13    Temp:    (!) 101.3 F (38.5 C)    TempSrc:    Oral  SpO2: 100% 100% 99%   Weight:      Height:       Eyes: PERRL, lids and conjunctivae normal ENMT: Mucous membranes are moist. Posterior pharynx clear of any exudate or lesions.Normal dentition.  Neck: normal, supple, no masses, no thyromegaly, neck pain with moving neck side to side but no nuchal rigidity. Respiratory: clear to auscultation bilaterally, no wheezing, no crackles. Normal respiratory effort. No accessory muscle use.  Cardiovascular: Regular rate and rhythm, no murmurs / rubs / gallops. No extremity edema. 2+ pedal pulses. No carotid bruits.  Abdomen: no tenderness, no masses palpated. No hepatosplenomegaly. Bowel sounds positive.  Musculoskeletal: no clubbing / cyanosis. No joint deformity upper and lower extremities. Good ROM, no contractures. Normal muscle tone.  Brudzinski's sign is negative. Skin: no rashes, lesions, ulcers. No induration Neurologic: CN 2-12 grossly intact. Sensation intact, DTR normal. Strength 5/5 in all 4.  Psychiatric: Normal judgment and insight. Alert and oriented x 3. Normal mood.   Labs on Admission: I have personally reviewed following labs and imaging studies  CBC: Recent Labs  Lab 02/28/19 0508 03/02/19 1750  WBC 6.6 5.8  NEUTROABS 4.9 3.4  HGB 13.6 13.0  HCT 41.6 40.9  MCV 89.8 89.9  PLT 213 181   Basic Metabolic Panel: Recent Labs  Lab 02/28/19 0508 03/02/19 1750  NA 130* 133*  K 4.0 3.6  CL 97* 99  CO2 23 23  GLUCOSE 111* 105*  BUN 10 5*  CREATININE 0.73 0.70  CALCIUM 8.3* 8.7*   GFR: Estimated Creatinine Clearance: 172.6 mL/min (by C-G formula based on SCr of 0.7 mg/dL). Liver Function Tests: Recent Labs  Lab 02/28/19 0508 03/02/19 1750  AST 132* 250*  ALT 67* 178*  ALKPHOS 88 213*  BILITOT 1.5* 4.3*  PROT 7.7 7.7  ALBUMIN 3.8 3.4*   Recent Labs  Lab 03/02/19 1750  LIPASE 30   No results for input(s): AMMONIA in the last 168 hours. Coagulation Profile: No results for input(s): INR,  PROTIME in the last 168 hours. Cardiac Enzymes: No results for input(s): CKTOTAL, CKMB, CKMBINDEX, TROPONINI in the last 168 hours. BNP (last 3 results) No results for input(s): PROBNP in the last 8760 hours. HbA1C: No results for input(s): HGBA1C in the last 72 hours. CBG: No results for input(s): GLUCAP in the last 168 hours. Lipid Profile: No results for input(s): CHOL, HDL, LDLCALC, TRIG, CHOLHDL, LDLDIRECT in the last 72 hours. Thyroid Function Tests: No results for input(s): TSH, T4TOTAL, FREET4, T3FREE, THYROIDAB in the last 72 hours. Anemia Panel: No results for input(s): VITAMINB12, FOLATE, FERRITIN, TIBC, IRON, RETICCTPCT in the last 72 hours. Urine analysis:    Component Value Date/Time   COLORURINE AMBER (A) 03/02/2019 2232   APPEARANCEUR CLEAR 03/02/2019 2232   LABSPEC 1.026 03/02/2019 2232   PHURINE 5.0 03/02/2019 2232  GLUCOSEU NEGATIVE 03/02/2019 2232   HGBUR NEGATIVE 03/02/2019 2232   BILIRUBINUR SMALL (A) 03/02/2019 2232   KETONESUR NEGATIVE 03/02/2019 2232   PROTEINUR 100 (A) 03/02/2019 2232   NITRITE NEGATIVE 03/02/2019 2232   LEUKOCYTESUR MODERATE (A) 03/02/2019 2232    Radiological Exams on Admission: CT Head Wo Contrast  Result Date: 03/02/2019 CLINICAL DATA:  Headache EXAM: CT HEAD WITHOUT CONTRAST TECHNIQUE: Contiguous axial images were obtained from the base of the skull through the vertex without intravenous contrast. COMPARISON:  None. FINDINGS: Brain: There is no mass, hemorrhage or extra-axial collection. The size and configuration of the ventricles and extra-axial CSF spaces are normal. The brain parenchyma is normal, without acute or chronic infarction. Vascular: No abnormal hyperdensity of the major intracranial arteries or dural venous sinuses. No intracranial atherosclerosis. Skull: The visualized skull base, calvarium and extracranial soft tissues are normal. Sinuses/Orbits: No fluid levels or advanced mucosal thickening of the visualized  paranasal sinuses. No mastoid or middle ear effusion. The orbits are normal. IMPRESSION: Normal head CT. Electronically Signed   By: Deatra Robinson M.D.   On: 03/02/2019 19:16   CT ABDOMEN PELVIS W CONTRAST  Result Date: 03/03/2019 CLINICAL DATA:  Fever of unknown origin. Headache for 1 week. EXAM: CT ABDOMEN AND PELVIS WITH CONTRAST TECHNIQUE: Multidetector CT imaging of the abdomen and pelvis was performed using the standard protocol following bolus administration of intravenous contrast. CONTRAST:  OMNIPAQUE IOHEXOL 300 MG/ML  SOLN COMPARISON:  None. FINDINGS: Lower chest: The lung bases are clear. No focal airspace disease. No pleural fluid. Hepatobiliary: Decreased hepatic density consistent with steatosis. No focal hepatic abnormality. Gallbladder physiologically distended, no calcified stone. No biliary dilatation. Pancreas: No ductal dilatation or inflammation. Spleen: Normal in size spanning 12.2 cm AP. No focal abnormality. Adrenals/Urinary Tract: Normal adrenal glands. 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 No hydronephrosis of either kidney. No perinephric edema. No visualized renal stones. Urinary bladder is physiologically distended. No bladder wall thickening. Stomach/Bowel: Nondistended stomach. No bowel obstruction or inflammation. Normal appendix. No evidence of bowel wall thickening. Colon is decompressed. No colonic wall thickening. Vascular/Lymphatic: No acute vascular findings. The abdominal aorta and IVC are intact. No evidence of portal vein thrombosis, evaluation slightly limited by phase of contrast. Small retroperitoneal nodes not enlarged by size criteria. Few prominent bilateral inguinal nodes are nonspecific but likely reactive. Reproductive: Uterus and bilateral adnexa are unremarkable. Other: No free air, free fluid, or intra-abdominal fluid collection. Small fat containing umbilical hernia. Musculoskeletal: There are no acute or suspicious osseous  abnormalities. IMPRESSION: 1. No acute abnormality or explanation for abdominal pain. 2. 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 3. Hepatic steatosis. Electronically Signed   By: Narda Rutherford M.D.   On: 03/03/2019 00:09   DG Chest Portable 1 View  Result Date: 03/02/2019 CLINICAL DATA:  Fever and headache EXAM: PORTABLE CHEST 1 VIEW COMPARISON:  February 28, 2019 FINDINGS: The heart size and mediastinal contours are mildly prominent as on prior exam. Both lungs are clear. The visualized skeletal structures are unremarkable. IMPRESSION: No active disease. Electronically Signed   By: Jonna Clark M.D.   On: 03/02/2019 18:29      Assessment/Plan Principal Problem:   High fever Patient had a fever of 103.1 on arrival to the hospital. Patient was managed with IV vancomycin, IV ceftriaxone, IV morphine and Tylenol for suspected meningitis. IR consulted by ER physician for LP Will wait for CSF tests. Continue IV vancomycin and IV  ceftriaxone in the meantime for suspected meningitis.  Active Problems:   Elevated liver enzymes  Patient has elevated alkaline phosphatase and worsening transaminitis as compared to the previous visit. CT abdomen showed hepatosteatosis. Patient denies any pain in the right upper quadrant. Hepatitis panel and right upper quadrant ultrasound ordered.    Headache CT head was negative for acute intracranial bleed or pathology. Tylenol as needed for pain ordered    Fever and chills Fever most likely secondary to suspected meningitis. Continue Tylenol and antibiotics.   DVT prophylaxis: Lovenox Code Status: Full code Family Communication:  Consults called: IR consulted by ER physician for LP Admission status: Inpatient/MedSurg   Edmonia Lynch MD [facility]   If 7PM-7AM, please contact night-coverage [email or web address] Password  03/03/2019, 5:11 AM

## 2019-03-03 NOTE — Progress Notes (Addendum)
Young female who lives in a college apartment with 2 other room mates presents with headache, high fever, neck discomfort.Patient had a fever of 103.1 on arrival to the hospital. She denies any dysuria, abnormal vaginal discharge or genital/herpetic lesions. She is sexually active with a single partner, last encounter was 2 months back. Denies any known sick contacts. Recieved IV vancomycin, IV ceftriaxone, IV morphine and Tylenol for suspected meningitis.IR consulted by ER physician for LP and patient just came back from the procedure.  Elevated liver enzymes noted- CT abdomen showed hepatosteatosis.Patient denies any pain in the right upper quadrant. F/u Hepatitis panel, HIV, blood cx and CSF results--could be aseptic meningitis. COVID -ve on 12/18 and 12/20. Flu -ve. Pharmacy dosing vanc.

## 2019-03-03 NOTE — Procedures (Signed)
Radiation Exposure Index (as provided by the fluoroscopic device): [3 minutes 18 seconds]  If the device does not provide the exposure index: Fluoroscopy Time (in minutes and seconds): [116 mGy] Number of Acquired Images: [1] PROCEDURE: Informed consent was obtained from the patient prior to the procedure, including potential complications of headache, allergy, and pain. With the patient prone, the lower back was prepped with Betadine. 1% Lidocaine was used for local anesthesia. Lumbar puncture was performed at the [L3-L4] level using a [20] gauge needle with return of [clear] CSF with an opening pressure of [15] cm water. [Five] ml of CSF were obtained for laboratory studies. The patient tolerated the procedure well and there were no apparent complications.  The CSF was under very low pressure therefore low volume collection.   IMPRESSION: [ 1. Successful lumbar puncture

## 2019-03-03 NOTE — ED Provider Notes (Signed)
Fever and Headache x 3 days Seen 2 days ago, tx for UTI Symptoms persistent, so she comes back LP failed x 2 in ED IR doc supposedly coming in for IR LP - 2 hours ago Mild transaminitis, worsening COVID x 2 negative Rocephin, Vanc  Given for ?meningitis  Plan to admit  1:40 - Discussed admission with the hospitalist, Dr. Humphrey Rolls, who accepts for admission. Patient updated and agrees with plan.    Charlann Lange, PA-C 03/03/19 0140    Lacretia Leigh, MD 03/04/19 [phone]

## 2019-03-03 NOTE — Progress Notes (Signed)
   03/03/19 2044  MEWS Score  Resp 20  Pulse Rate (!) 106  BP 95/70  Temp (!) 101.2 F (38.4 C)  SpO2 97 %  MEWS Score  MEWS RR 0  MEWS Pulse 1  MEWS Systolic 1  MEWS LOC 0  MEWS Temp 1  MEWS Score 3  MEWS Score Color Yellow  MEWS Assessment  Is this an acute change? Yes  MEWS guidelines implemented *See Row Information* Yellow  Rapid Response Notification  Name of Rapid Response RN Notified NA   Provider Notification  Provider Name/Title Georges Mouse   Date Provider Notified 03/03/19  Time Provider Notified 2045  Notification Type Page  Notification Reason Change in status  Response See new orders  Date of Provider Response 03/03/19  Time of Provider Response 2100  PRN tylenol, IVF ordered since ST and temp, soft BP.  Unrelieved pain by tramadol, new 1x order received.

## 2019-03-04 ENCOUNTER — Inpatient Hospital Stay (HOSPITAL_COMMUNITY): Payer: Self-pay

## 2019-03-04 DIAGNOSIS — R519 Headache, unspecified: Secondary | ICD-10-CM

## 2019-03-04 DIAGNOSIS — R748 Abnormal levels of other serum enzymes: Secondary | ICD-10-CM

## 2019-03-04 DIAGNOSIS — R1114 Bilious vomiting: Secondary | ICD-10-CM

## 2019-03-04 DIAGNOSIS — R509 Fever, unspecified: Secondary | ICD-10-CM

## 2019-03-04 DIAGNOSIS — R7401 Elevation of levels of liver transaminase levels: Secondary | ICD-10-CM

## 2019-03-04 DIAGNOSIS — G4489 Other headache syndrome: Secondary | ICD-10-CM

## 2019-03-04 LAB — HEPATIC FUNCTION PANEL
ALT: 162 U/L — ABNORMAL HIGH (ref 0–44)
AST: 197 U/L — ABNORMAL HIGH (ref 15–41)
Albumin: 2.9 g/dL — ABNORMAL LOW (ref 3.5–5.0)
Alkaline Phosphatase: 186 U/L — ABNORMAL HIGH (ref 38–126)
Bilirubin, Direct: 1.8 mg/dL — ABNORMAL HIGH (ref 0.0–0.2)
Indirect Bilirubin: 1.3 mg/dL — ABNORMAL HIGH (ref 0.3–0.9)
Total Bilirubin: 3.1 mg/dL — ABNORMAL HIGH (ref 0.3–1.2)
Total Protein: 6.5 g/dL (ref 6.5–8.1)

## 2019-03-04 LAB — C-REACTIVE PROTEIN: CRP: 16.1 mg/dL — ABNORMAL HIGH (ref <1.0)

## 2019-03-04 LAB — FERRITIN: Ferritin: 852 ng/mL — ABNORMAL HIGH (ref 11–307)

## 2019-03-04 LAB — SEDIMENTATION RATE: Sed Rate: 40 mm/hr — ABNORMAL HIGH (ref 0–22)

## 2019-03-04 LAB — URINE CULTURE

## 2019-03-04 MED ORDER — ONDANSETRON HCL 4 MG PO TABS
4.0000 mg | ORAL_TABLET | Freq: Three times a day (TID) | ORAL | Status: DC | PRN
  Administered 2019-03-04: 10:00:00 4 mg via ORAL
  Filled 2019-03-04: qty 1

## 2019-03-04 MED ORDER — KETOROLAC TROMETHAMINE 15 MG/ML IJ SOLN
15.0000 mg | Freq: Three times a day (TID) | INTRAMUSCULAR | Status: DC | PRN
  Administered 2019-03-04 – 2019-03-08 (×4): 15 mg via INTRAVENOUS
  Filled 2019-03-04 (×4): qty 1

## 2019-03-04 MED ORDER — MECLIZINE HCL 25 MG PO TABS
25.0000 mg | ORAL_TABLET | Freq: Once | ORAL | Status: AC
  Administered 2019-03-04: 25 mg via ORAL
  Filled 2019-03-04: qty 1

## 2019-03-04 MED ORDER — SODIUM CHLORIDE 0.9 % IV SOLN: INTRAVENOUS | Status: DC

## 2019-03-04 MED ORDER — ONDANSETRON HCL 4 MG/2ML IJ SOLN
4.0000 mg | Freq: Four times a day (QID) | INTRAMUSCULAR | Status: DC | PRN
  Administered 2019-03-04 – 2019-03-05 (×2): 4 mg via INTRAVENOUS
  Filled 2019-03-04 (×2): qty 2

## 2019-03-04 MED ORDER — FAMOTIDINE IN NACL 20-0.9 MG/50ML-% IV SOLN
20.0000 mg | Freq: Two times a day (BID) | INTRAVENOUS | Status: DC
  Administered 2019-03-04 – 2019-03-07 (×7): 20 mg via INTRAVENOUS
  Filled 2019-03-04 (×7): qty 50

## 2019-03-04 MED ORDER — SODIUM CHLORIDE 0.9 % IV SOLN: 2.0000 g | INTRAVENOUS | Status: DC

## 2019-03-04 NOTE — Progress Notes (Signed)
   03/04/19 1637  MEWS Score  Pulse Rate (!) 118  BP 127/86  SpO2 100 %  MEWS Score  MEWS RR 0  MEWS Pulse 2  MEWS Systolic 0  MEWS LOC 0  MEWS Temp 0  MEWS Score 2  MEWS Score Color Yellow  MEWS Assessment  Is this an acute change? No  MEWS guidelines implemented *See Row Information* Yellow  Provider Notification  Provider Name/Title Kamineni  Date Provider Notified 03/04/19  Time Provider Notified 0786  Notification Type Page  Notification Reason Change in status  Response See new orders  Date of Provider Response 03/04/19  Time of Provider Response 1700

## 2019-03-04 NOTE — Progress Notes (Addendum)
PROGRESS NOTE    Daisy Goodman  OIL:[id number]  DOB: Feb 22, 1996  PCP: Trey Sailors, PA Brief summary:Young female who lives in a college apartment with 2 other room mates presents with headache, high fever, neck discomfort.Patient had a fever of 103.1 on arrival to the hospital. She denies any dysuria, abnormal vaginal discharge or genital/herpetic lesions. She is sexually active with a single partner, last encounter was 2 months back. Denies any known sick contacts. Recieved IV vancomycin, IV ceftriaxone, IV morphine and Tylenol for suspected meningitis.IR consulted by ER physician for LP and patient just came back from the procedure.  Elevated liver enzymes noted- CT abdomen showed hepatosteatosis.Patient denies any pain in the right upper quadrant. F/u Hepatitis panel, HIV, blood cx and CSF results--could be aseptic meningitis. COVID -ve on 12/18 and 12/20. Flu -ve. Pharmacy dosing vanc. Subjective: Patient resting comfortably. C/O fever last night with Tmax 102F. States had associated headache but improved after used ice pack. Also had an episode of nausea/vomiting Objective: Vitals:   03/03/19 2252 03/04/19 0026 03/04/19 0505 03/04/19 0834  BP: (!) 102/54 109/60 116/77 115/68  Pulse: 93 85 84 86  Resp: '16 16 16   '$ Temp: 99.2 F (37.3 C) 98.5 F (36.9 C) 98.6 F (37 C) 98.4 F (36.9 C)  TempSrc: Oral Oral Oral Oral  SpO2: 97% 100% 100% 99%  Weight:      Height:        Intake/Output Summary (Last 24 hours) at 03/04/2019 1247 Last data filed at 03/04/2019 1026 Gross per 24 hour  Intake 1549.06 ml  Output 0 ml  Net 1549.06 ml   Filed Weights   03/02/19 1711  Weight: 136 kg    Physical Examination:  General exam: Appears calm and comfortable  Respiratory system: Clear to auscultation. Respiratory effort normal. Cardiovascular system: S1 & S2 heard, RRR. No JVD, murmurs, rubs, gallops or clicks. No pedal edema. Gastrointestinal system: Abdomen is nondistended, soft  and nontender. Normal bowel sounds heard. Central nervous system: Alert and oriented. No new focal neurological deficits. Extremities: No contractures, edema or joint deformities.  Skin: No rashes, lesions or ulcers Psychiatry: Judgement and insight appear normal. Mood & affect appropriate.   Data Reviewed: I have personally reviewed following labs and imaging studies  CBC: Recent Labs  Lab 02/28/19 0508 03/02/19 1750 03/03/19 0530  WBC 6.6 5.8 6.3  NEUTROABS 4.9 3.4  --   HGB 13.6 13.0 12.3  HCT 41.6 40.9 38.7  MCV 89.8 89.9 91.3  PLT 213 181 015*   Basic Metabolic Panel: Recent Labs  Lab 02/28/19 0508 03/02/19 1750 03/03/19 0530  NA 130* 133* 135  K 4.0 3.6 3.7  CL 97* 99 103  CO2 '23 23 24  '$ GLUCOSE 111* 105* 99  BUN 10 5* 6  CREATININE 0.73 0.70 0.64  CALCIUM 8.3* 8.7* 8.4*   GFR: Estimated Creatinine Clearance: 172.6 mL/min (by C-G formula based on SCr of 0.64 mg/dL). Liver Function Tests: Recent Labs  Lab 02/28/19 0508 03/02/19 1750 03/03/19 0530 03/04/19 0608  AST 132* 250* 225* 197*  ALT 67* 178* 165* 162*  ALKPHOS 88 213* 185* 186*  BILITOT 1.5* 4.3* 3.8* 3.1*  PROT 7.7 7.7 7.0 6.5  ALBUMIN 3.8 3.4* 3.1* 2.9*   Recent Labs  Lab 03/02/19 1750  LIPASE 30   No results for input(s): AMMONIA in the last 168 hours. Coagulation Profile: No results for input(s): INR, PROTIME in the last 168 hours. Cardiac Enzymes: No results for input(s): CKTOTAL, CKMB,  CKMBINDEX, TROPONINI in the last 168 hours. BNP (last 3 results) No results for input(s): PROBNP in the last 8760 hours. HbA1C: No results for input(s): HGBA1C in the last 72 hours. CBG: No results for input(s): GLUCAP in the last 168 hours. Lipid Profile: No results for input(s): CHOL, HDL, LDLCALC, TRIG, CHOLHDL, LDLDIRECT in the last 72 hours. Thyroid Function Tests: No results for input(s): TSH, T4TOTAL, FREET4, T3FREE, THYROIDAB in the last 72 hours. Anemia Panel: Recent Labs     03/04/19 0954  FERRITIN 852*   Sepsis Labs: Recent Labs  Lab 03/02/19 1751 03/02/19 1951  LATICACIDVEN 1.1 0.9    Recent Results (from the past 240 hour(s))  Novel Coronavirus, NAA (Hosp order, Send-out to Ref Lab; TAT 18-24 hrs     Status: None   Collection Time: 02/28/19  4:36 AM   Specimen: Nasopharyngeal Swab; Respiratory  Result Value Ref Range Status   SARS-CoV-2, NAA NOT DETECTED NOT DETECTED Final    Comment: (NOTE) This nucleic acid amplification test was developed and its performance characteristics determined by [employer]. Nucleic acid amplification tests include PCR and TMA. This test has not been FDA cleared or approved. This test has been authorized by FDA under an Emergency Use Authorization (EUA). This test is only authorized for the duration of time the declaration that circumstances exist justifying the authorization of the emergency use of in vitro diagnostic tests for detection of SARS-CoV-2 virus and/or diagnosis of COVID-19 infection under section 564(b)(1) of the Act, 21 U.S.C. 161WRU-0(A) (1), unless the authorization is terminated or revoked sooner. When diagnostic testing is negative, the possibility of a false negative result should be considered in the context of a patient's recent exposures and the presence of clinical signs and symptoms consistent with COVID-19. An individual without symptoms of COVID- 19 and who is not shedding SARS-CoV-2 vi rus would expect to have a negative (not detected) result in this assay. Performed At: [facility] [place], [place] [id number] Rush Farmer MD YN:[id number]    [place]  Final    Comment: Performed at [facility] [address] [place], [place] [address]  Urine culture     Status: Abnormal   Collection Time: 02/28/19  6:31 AM   Specimen: Urine, Clean Catch  Result Value Ref Range Status   Specimen Description   Final     URINE, CLEAN CATCH Performed at [facility], [place] [address] [place], [place] [address]    Special Requests   Final    NONE Performed at [facility], [place] [address], [place],  [address]    Culture MULTIPLE SPECIES PRESENT, SUGGEST RECOLLECTION (A)  Final   Report Status 03/01/2019 FINAL  Final  Respiratory Panel by RT PCR (Flu A&B, Covid) - Nasopharyngeal Swab     Status: None   Collection Time: 03/02/19  6:17 PM   Specimen: Nasopharyngeal Swab  Result Value Ref Range Status   SARS Coronavirus 2 by RT PCR NEGATIVE NEGATIVE Final    Comment: (NOTE) SARS-CoV-2 target nucleic acids are NOT DETECTED. The SARS-CoV-2 RNA is generally detectable in upper respiratoy specimens during the acute phase of infection. The lowest concentration of SARS-CoV-2 viral copies this assay can detect is 131 copies/mL. A negative result does not preclude SARS-Cov-2 infection and should not be used as the sole basis for treatment or other patient management decisions. A negative result may occur with  improper specimen collection/handling, submission of specimen other than  nasopharyngeal swab, presence of viral mutation(s) within the areas targeted by this assay, and inadequate number of viral copies (<131 copies/mL). A negative result must be combined with clinical observations, patient history, and epidemiological information. The expected result is Negative. Fact Sheet for Patients:  PinkCheek.be Fact Sheet for Healthcare Providers:  GravelBags.it This test is not yet ap proved or cleared by the [place] FDA and  has been authorized for detection and/or diagnosis of SARS-CoV-2 by FDA under an Emergency Use Authorization (EUA). This EUA will remain  in effect (meaning this test can be used) for the duration of the COVID-19 declaration under Section 564(b)(1) of the Act, 21 U.S.C. section  360bbb-3(b)(1), unless the authorization is terminated or revoked sooner.    Influenza A by PCR NEGATIVE NEGATIVE Final   Influenza B by PCR NEGATIVE NEGATIVE Final    Comment: (NOTE) The Xpert Xpress SARS-CoV-2/FLU/RSV assay is intended as an aid in  the diagnosis of influenza from Nasopharyngeal swab specimens and  should not be used as a sole basis for treatment. Nasal washings and  aspirates are unacceptable for Xpert Xpress SARS-CoV-2/FLU/RSV  testing. Fact Sheet for Patients: PinkCheek.be Fact Sheet for Healthcare Providers: GravelBags.it This test is not yet approved or cleared by the [place] FDA and  has been authorized for detection and/or diagnosis of SARS-CoV-2 by  FDA under an Emergency Use Authorization (EUA). This EUA will remain  in effect (meaning this test can be used) for the duration of the  Covid-19 declaration under Section 564(b)(1) of the Act, 21  U.S.C. section 360bbb-3(b)(1), unless the authorization is  terminated or revoked. Performed at [facility], [place] [address] [place], [place] [address]   Blood culture (routine x 2)     Status: None (Preliminary result)   Collection Time: 03/02/19  8:47 PM   Specimen: BLOOD  Result Value Ref Range Status   Specimen Description   Final    BLOOD BLOOD LEFT FOREARM Performed at [facility] [address], [place], [place] [address]    Special Requests   Final    BOTTLES DRAWN AEROBIC AND ANAEROBIC Blood Culture adequate volume Performed at [facility] [address] [place], [place] [address]    Culture   Final    NO GROWTH 2 DAYS Performed at [facility] [address] [place], [place] [address]    Report Status PENDING  Incomplete  Blood culture (routine x 2)     Status: None (Preliminary result)   Collection Time: 03/02/19  8:47 PM   Specimen: BLOOD  Result Value Ref Range  Status   Specimen Description   Final    BLOOD BLOOD RIGHT FOREARM Performed at [facility] [address] [place], [place] [address]    Special Requests   Final    BOTTLES DRAWN AEROBIC AND ANAEROBIC Blood Culture adequate volume Performed at [facility] [address], [place], [place] [address]    Culture   Final    NO GROWTH 2 DAYS Performed at [facility] [address], [place], [place] [address]    Report Status PENDING  Incomplete  Urine culture     Status: Abnormal   Collection Time: 03/02/19 10:32 PM   Specimen: Urine, Random  Result Value Ref Range Status   Specimen Description   Final    URINE, RANDOM Performed at [facility] [address] [place], [place] [address]  Special Requests   Final    NONE Performed at [facility], [place] [address], [place], [place] [address]    Culture MULTIPLE SPECIES PRESENT, SUGGEST RECOLLECTION (A)  Final   Report Status 03/04/2019 FINAL  Final  CSF culture     Status: None (Preliminary result)   Collection Time: 03/03/19 11:05 AM   Specimen: CSF; Cerebrospinal Fluid  Result Value Ref Range Status   Specimen Description   Final    CSF Performed at [facility] [address], [place], [place] [address]    Special Requests   Final    Normal Performed at [facility], [place] [address], [place], [place] [address]    Gram Stain   Final    NO WBC SEEN NO ORGANISMS SEEN CYTOSPIN Gram Stain Report Called to,Read Back By and Verified With: BLOCK, D. RN 9937 ON 12.21.2020 BY [facility] Performed at [facility], [place] [address], [place], [place] [address]    Culture   Final    NO GROWTH < 24 HOURS Performed at [facility] [address] [place], [place] [address]    Report Status PENDING  Incomplete      Radiology Studies: CT Head Wo Contrast  Result Date:  03/02/2019 CLINICAL DATA:  Headache EXAM: CT HEAD WITHOUT CONTRAST TECHNIQUE: Contiguous axial images were obtained from the base of the skull through the vertex without intravenous contrast. COMPARISON:  None. FINDINGS: Brain: There is no mass, hemorrhage or extra-axial collection. The size and configuration of the ventricles and extra-axial CSF spaces are normal. The brain parenchyma is normal, without acute or chronic infarction. Vascular: No abnormal hyperdensity of the major intracranial arteries or dural venous sinuses. No intracranial atherosclerosis. Skull: The visualized skull base, calvarium and extracranial soft tissues are normal. Sinuses/Orbits: No fluid levels or advanced mucosal thickening of the visualized paranasal sinuses. No mastoid or middle ear effusion. The orbits are normal. IMPRESSION: Normal head CT. Electronically Signed   By: Ulyses Jarred M.D.   On: 03/02/2019 19:16   CT ABDOMEN PELVIS W CONTRAST  Result Date: 03/03/2019 CLINICAL DATA:  Fever of unknown origin. Headache for 1 week. EXAM: CT ABDOMEN AND PELVIS WITH CONTRAST TECHNIQUE: Multidetector CT imaging of the abdomen and pelvis was performed using the standard protocol following bolus administration of intravenous contrast. CONTRAST:  118m OMNIPAQUE IOHEXOL 300 MG/ML  SOLN COMPARISON:  None. FINDINGS: Lower chest: The lung bases are clear. No focal airspace disease. No pleural fluid. Hepatobiliary: Decreased hepatic density consistent with steatosis. No focal hepatic abnormality. Gallbladder physiologically distended, no calcified stone. No biliary dilatation. Pancreas: No ductal dilatation or inflammation. Spleen: Normal in size spanning 12.2 cm AP. No focal abnormality. Adrenals/Urinary Tract: Normal adrenal glands. 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 No hydronephrosis of either kidney. No perinephric edema. No visualized renal stones. Urinary bladder is physiologically distended. No bladder  wall thickening. Stomach/Bowel: Nondistended stomach. No bowel obstruction or inflammation. Normal appendix. No evidence of bowel wall thickening. Colon is decompressed. No colonic wall thickening. Vascular/Lymphatic: No acute vascular findings. The abdominal aorta and IVC are intact. No evidence of portal vein thrombosis, evaluation slightly limited by phase of contrast. Small retroperitoneal nodes not enlarged by size criteria. Few prominent bilateral inguinal nodes are nonspecific but likely reactive. Reproductive: Uterus and bilateral adnexa are unremarkable. Other: No free air, free fluid, or intra-abdominal fluid collection. Small fat containing umbilical hernia. Musculoskeletal: There are no acute or suspicious osseous  abnormalities. IMPRESSION: 1. No acute abnormality or explanation for abdominal pain. 2. 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 3. Hepatic steatosis. Electronically Signed   By: Keith Rake M.D.   On: 03/03/2019 00:09   DG Chest Portable 1 View  Result Date: 03/02/2019 CLINICAL DATA:  Fever and headache EXAM: PORTABLE CHEST 1 VIEW COMPARISON:  February 28, 2019 FINDINGS: The heart size and mediastinal contours are mildly prominent as on prior exam. Both lungs are clear. The visualized skeletal structures are unremarkable. IMPRESSION: No active disease. Electronically Signed   By: Prudencio Pair M.D.   On: 03/02/2019 18:29   DG Lumbar Puncture Fluoro Guide  Result Date: 03/03/2019 CLINICAL DATA:  Headache, fever, concern for meningitis EXAM: DIAGNOSTIC LUMBAR PUNCTURE UNDER FLUOROSCOPIC GUIDANCE FLUOROSCOPY TIME:  Radiation Exposure Index (as provided by the fluoroscopic device): 3 minutes 18 seconds If the device does not provide the exposure index: Fluoroscopy Time (in minutes and seconds):  116 mGy Number of Acquired Images:  1 PROCEDURE: Informed consent was obtained from the patient prior to the procedure, including potential complications of  headache, allergy, and pain. With the patient prone, the lower back was prepped with Betadine. 1% Lidocaine was used for local anesthesia. Lumbar puncture was performed at the L3-L4 level using a 20 gauge needle with return of clear CSF with an opening pressure of 15 cm water. Five ml of CSF were obtained for laboratory studies. The patient tolerated the procedure well and there were no apparent complications. The CSF was under very low pressure therefore low volume collection. IMPRESSION: 1. Successful lumbar puncture Electronically Signed   By: Suzy Bouchard M.D.   On: 03/03/2019 12:10     Scheduled Meds: Continuous Infusions: . [START ON 03/05/2019] cefTRIAXone (ROCEPHIN)  IV      Assessment & Plan:   1.  Fever/Headache: Patient continues to spike temp but no leucocytosis. CSF analysis unremarkable. Doubt meningitis. Changed Rocephin dosing to cover UTI. D/Ced Vancomycin. Blood and urine cx unremarkable so far (mixed species on urine cx) . She did take Keflex x 2 days at home and been on rocephin here. Can likely discontinue abx in am if no other source of infection found. Check ESR/CRP/Ferritin. Recent Mono test -ve. CT abd largely unremarkable. CXR no infiltrates. COVID X2 neg. Denies any abnormal vaginal discharge, tick bites, recent travel, pets or sick contacts. Requested ID eval. Mother also mentions IUD but patient denied any vaginal symptoms/cramping  2. Nausea/vomiting : Gastritis vs cholecystitis. Given elevated LFTs, and GB appearance on CT , will obtain RUQ USG. IV pepcid added  3. Elevated LFTs.Hepatitis/HIV/recent Mono test unremarkable.Denies any tick bites/skin rash/abx intolerance  4. Dizziness: c/o feeling dizzy on changing positions. Check orthostatics. ?BPPV. Ct head unremarkable  DVT prophylaxis: lovenox Code Status: Full code Family / Patient Communication: d/w patient, mother on the phone while talking to patient Disposition Plan: home when medically clear.d/w  ID     LOS: 1 day    Time spent: 35 minutes    Guilford Shi, MD [facility] Pager [phone]  If 7PM-7AM, please contact night-coverage [email or web address] Password [facility] 03/04/2019, 12:47 PM

## 2019-03-04 NOTE — Consult Note (Signed)
[facility]       Reason for Consult: fever    Referring Physician: Dr. Lajuana RippleKamineni  Principal Problem:   High fever Active Problems:   Elevated liver enzymes   Headache   Fever and chills   Fever     Recommendations: Stop ceftriaxone Supportive care CMV, EBV, HIV RNA  Droplet precautions discontinued I have scheduled follow up for her in our clinic for next week and we can recheck her LFTs and go over results  Will not otherwise follow up, thanks for consultation  Assessment: She has fever, transaminitis, headache, most c/w a viral illness.  CSF not consistent with meningitis.    Antibiotics: ceftriaxone  HPI: Rondell ReamsLorissa A Bentson is a 23 y.o. female college student living in an apartment with roommates here with a fever, headache and n/v.  Noted transaminitis on labs.  Negative HIV, hepatitis panel, CSF.  CBC wnl.  COVID negative x 3.  No vision changes.  Some lightheadedness with sitting up and standing.  + myalgias.  Some vomiting when she stood up.  No nausea.  No dysuria.  No abdominal pain, no diarrhea.  No known sick contacts.  No travel.     Review of Systems:  Constitutional: positive for fevers, chills, fatigue, malaise and anorexia or negative for weight loss Respiratory: negative for cough or sputum Gastrointestinal: negative for constipation Genitourinary: negative for genital lesions and vaginal discharge Integument/breast: negative for rash Musculoskeletal: negative for arthralgias All other systems reviewed and are negative    Past Medical History:  Diagnosis Date  . Asthma     Social History   Tobacco Use  . Smoking status: Never Smoker  . Smokeless tobacco: Never Used  Substance Use Topics  . Alcohol use: No  . Drug use: No    Family History  Problem Relation Age of Onset  . Hypertension Other   . Heart attack Other     No Known Allergies  Physical Exam: Constitutional: non-toxic  Vitals:   03/04/19 0834  03/04/19 1303  BP: 115/68 (!) 91/52  Pulse: 86 87  Resp:    Temp: 98.4 F (36.9 C) 99 F (37.2 C)  SpO2: 99% 100%   EYES: anicteric ENMT: + mask Cardiovascular: Cor RRR Respiratory: CTA B; normal respiratory effort GI: Bowel sounds are normal, liver is not enlarged, spleen is not enlarged Musculoskeletal: no pedal edema noted Skin: negatives: no rash Hematologic: no cervical lad anterior or posterior Neuro: non nuchal rigidity  Lab Results  Component Value Date   WBC 6.3 03/03/2019   HGB 12.3 03/03/2019   HCT 38.7 03/03/2019   MCV 91.3 03/03/2019   PLT 147 (L) 03/03/2019    Lab Results  Component Value Date   CREATININE 0.64 03/03/2019   BUN 6 03/03/2019   NA 135 03/03/2019   K 3.7 03/03/2019   CL 103 03/03/2019   CO2 24 03/03/2019    Lab Results  Component Value Date   ALT 162 (H) 03/04/2019   AST 197 (H) 03/04/2019   ALKPHOS 186 (H) 03/04/2019     Microbiology: Recent Results (from the past 240 hour(s))  Novel Coronavirus, NAA (Hosp order, Send-out to [facility]; TAT 18-24 hrs     Status: None   Collection Time: 02/28/19  4:36 AM   Specimen: Nasopharyngeal Swab; Respiratory  Result Value Ref Range Status   SARS-CoV-2, NAA NOT DETECTED NOT DETECTED Final    Comment: (NOTE) This nucleic acid amplification test was developed and  its performance characteristics determined by [employer]. Nucleic acid amplification tests include PCR and TMA. This test has not been FDA cleared or approved. This test has been authorized by FDA under an Emergency Use Authorization (EUA). This test is only authorized for the duration of time the declaration that circumstances exist justifying the authorization of the emergency use of in vitro diagnostic tests for detection of SARS-CoV-2 virus and/or diagnosis of COVID-19 infection under section 564(b)(1) of the Act, 21 U.S.C. 161WRU-0(A) (1), unless the authorization is terminated or revoked sooner. When diagnostic testing  is negative, the possibility of a false negative result should be considered in the context of a patient's recent exposures and the presence of clinical signs and symptoms consistent with COVID-19. An individual without symptoms of COVID- 19 and who is not shedding SARS-CoV-2 vi rus would expect to have a negative (not detected) result in this assay. Performed At: [facility] [address] [place], [place] [id number] Jolene Schimke MD YN:[id number]    Coronavirus Source NASOPHARYNGEAL  Final    Comment: Performed at [facility], [address], [place], [place] [address]  Urine culture     Status: Abnormal   Collection Time: 02/28/19  6:31 AM   Specimen: Urine, Clean Catch  Result Value Ref Range Status   Specimen Description   Final    URINE, CLEAN CATCH Performed at [facility], [address], [place], [place] [address]    Special Requests   Final    NONE Performed at [place] [facility], [address] [place], [place] [address]    Culture MULTIPLE SPECIES PRESENT, SUGGEST RECOLLECTION (A)  Final   Report Status 03/01/2019 FINAL  Final  Respiratory Panel by RT PCR (Flu A&B, Covid) - Nasopharyngeal Swab     Status: None   Collection Time: 03/02/19  6:17 PM   Specimen: Nasopharyngeal Swab  Result Value Ref Range Status   SARS Coronavirus 2 by RT PCR NEGATIVE NEGATIVE Final    Comment: (NOTE) SARS-CoV-2 target nucleic acids are NOT DETECTED. The SARS-CoV-2 RNA is generally detectable in upper respiratoy specimens during the acute phase of infection. The lowest concentration of SARS-CoV-2 viral copies this assay can detect is 131 copies/mL. A negative result does not preclude SARS-Cov-2 infection and should not be used as the sole basis for treatment or other patient management decisions. A negative result may occur with  improper specimen collection/handling, submission of specimen other than nasopharyngeal swab,  presence of viral mutation(s) within the areas targeted by this assay, and inadequate number of viral copies (<131 copies/mL). A negative result must be combined with clinical observations, patient history, and epidemiological information. The expected result is Negative. Fact Sheet for Patients:  https://www.moore.com/ Fact Sheet for Healthcare Providers:  https://www.young.biz/ This test is not yet ap proved or cleared by the [place] FDA and  has been authorized for detection and/or diagnosis of SARS-CoV-2 by FDA under an Emergency Use Authorization (EUA). This EUA will remain  in effect (meaning this test can be used) for the duration of the COVID-19 declaration under Section 564(b)(1) of the Act, 21 U.S.C. section 360bbb-3(b)(1), unless the authorization is terminated or revoked sooner.    Influenza A by PCR NEGATIVE NEGATIVE Final   Influenza B by PCR NEGATIVE NEGATIVE Final    Comment: (NOTE) The Xpert Xpress SARS-CoV-2/FLU/RSV assay is intended as an aid in  the diagnosis of influenza from Nasopharyngeal swab specimens and  should not be used as a  sole basis for treatment. Nasal washings and  aspirates are unacceptable for Xpert Xpress SARS-CoV-2/FLU/RSV  testing. Fact Sheet for Patients: https://www.moore.com/ Fact Sheet for Healthcare Providers: https://www.young.biz/ This test is not yet approved or cleared by the [place] FDA and  has been authorized for detection and/or diagnosis of SARS-CoV-2 by  FDA under an Emergency Use Authorization (EUA). This EUA will remain  in effect (meaning this test can be used) for the duration of the  Covid-19 declaration under Section 564(b)(1) of the Act, 21  U.S.C. section 360bbb-3(b)(1), unless the authorization is  terminated or revoked. Performed at [facility], [address] [place], [place] [address]   Blood culture  (routine x 2)     Status: None (Preliminary result)   Collection Time: 03/02/19  8:47 PM   Specimen: BLOOD  Result Value Ref Range Status   Specimen Description   Final    BLOOD BLOOD LEFT FOREARM Performed at [facility], [address], [place], [place] [address]    Special Requests   Final    BOTTLES DRAWN AEROBIC AND ANAEROBIC Blood Culture adequate volume Performed at [facility], [address] [place], [place] [address]    Culture   Final    NO GROWTH 2 DAYS Performed at [facility], [address], [place], [place] [address]    Report Status PENDING  Incomplete  Blood culture (routine x 2)     Status: None (Preliminary result)   Collection Time: 03/02/19  8:47 PM   Specimen: BLOOD  Result Value Ref Range Status   Specimen Description   Final    BLOOD BLOOD RIGHT FOREARM Performed at [facility], [address] [place], [place] [address]    Special Requests   Final    BOTTLES DRAWN AEROBIC AND ANAEROBIC Blood Culture adequate volume Performed at [facility], [address] [place], [place] [address]    Culture   Final    NO GROWTH 2 DAYS Performed at [facility], [address], [place], [place] [address]    Report Status PENDING  Incomplete  Urine culture     Status: Abnormal   Collection Time: 03/02/19 10:32 PM   Specimen: Urine, Random  Result Value Ref Range Status   Specimen Description   Final    URINE, RANDOM Performed at [facility], [address], [place], [place] [address]    Special Requests   Final    NONE Performed at [facility], [address] [place], [place] [address]    Culture MULTIPLE SPECIES PRESENT, SUGGEST RECOLLECTION (A)  Final   Report Status 03/04/2019 FINAL  Final  CSF culture     Status: None (Preliminary result)   Collection Time: 03/03/19 11:05 AM   Specimen: CSF; Cerebrospinal Fluid  Result  Value Ref Range Status   Specimen Description   Final    CSF Performed at [facility], [address], [place], [place] [address]    Special Requests   Final    Normal Performed at [facility], [address] [place], [place] [address]    Gram Stain   Final    NO WBC SEEN NO ORGANISMS SEEN CYTOSPIN Gram Stain Report Called to,Read Back By and Verified With: BLOCK, D. RN 1402 ON 12.21.2020 BY [facility] Performed at [facility], [address] [place], [place] [address]    Culture  Final    NO GROWTH < 24 HOURS Performed at [facility], [address], [place], [place] [address]    Report Status PENDING  Incomplete    Thayer Headings, [place] [place] [email or web address] 03/04/2019, 3:34 PM

## 2019-03-05 ENCOUNTER — Inpatient Hospital Stay (HOSPITAL_COMMUNITY): Payer: Self-pay

## 2019-03-05 LAB — GC/CHLAMYDIA PROBE AMP (~~LOC~~) NOT AT ARMC
Chlamydia: NEGATIVE
Comment: NEGATIVE
Comment: NORMAL
Neisseria Gonorrhea: NEGATIVE

## 2019-03-05 LAB — ROCKY MTN SPOTTED FVR ABS PNL(IGG+IGM)
RMSF IgG: NEGATIVE
RMSF IgM: 0.94 index — ABNORMAL HIGH (ref 0.00–0.89)

## 2019-03-05 MED ORDER — LORAZEPAM 2 MG/ML IJ SOLN
1.0000 mg | Freq: Once | INTRAMUSCULAR | Status: DC
  Filled 2019-03-05: qty 1

## 2019-03-05 MED ORDER — BUTALBITAL-APAP-CAFFEINE 50-325-40 MG PO TABS
1.0000 | ORAL_TABLET | Freq: Four times a day (QID) | ORAL | Status: DC | PRN
  Administered 2019-03-05 – 2019-03-06 (×3): 1 via ORAL
  Filled 2019-03-05 (×3): qty 1

## 2019-03-05 MED ORDER — MECLIZINE HCL 25 MG PO TABS
25.0000 mg | ORAL_TABLET | Freq: Three times a day (TID) | ORAL | Status: DC | PRN
  Administered 2019-03-05 – 2019-03-06 (×3): 25 mg via ORAL
  Filled 2019-03-05 (×4): qty 1

## 2019-03-05 NOTE — Progress Notes (Signed)
Pt went for MRI today, unable to fit in machine per MRI tech.

## 2019-03-05 NOTE — Progress Notes (Addendum)
PROGRESS NOTE    Daisy Goodman  QPR:[id number]  DOB: April 01, 1995  PCP: Trey Sailors, PA Brief summary:Young female who lives in a college apartment with 2 other room mates presents with headache, high fever, neck discomfort.Patient had a fever of 103.1 on arrival to the hospital. She denies any dysuria, abnormal vaginal discharge or genital/herpetic lesions. She is sexually active with a single partner, last encounter was 2 months back. Denies any known sick contacts. Recieved IV vancomycin, IV ceftriaxone, IV morphine and Tylenol for suspected meningitis.IR consulted by ER physician for LP and patient just came back from the procedure.  Elevated liver enzymes noted- CT abdomen showed hepatosteatosis.Patient denies any pain in the right upper quadrant. F/u Hepatitis panel, HIV, blood cx and CSF results--could be aseptic meningitis. COVID -ve on 12/18 and 12/20. Flu -ve. Pharmacy dosing vanc. Subjective: Patient seen by ID yesterday and felt to have viral syndrome.  She however does not feel ready to go home.  Reports feeling dizzy with headache (fronto parietal bilateral, dull, positional variant) and nausea whenever she tries to sit up or stand.  Denies any spinning sensation or vertigo.  Denies any ear discomfort or tinnitus.  Denies any visual changes.  Reports family history of migraines but denies any personal history of prior migraine diagnosis.  Mother on the phone and raise some concerns with her ongoing issues. Objective: Vitals:   03/04/19 2124 03/05/19 0600 03/05/19 1423 03/05/19 1447  BP: 116/71 109/67 119/69 100/82  Pulse: 76 90 77 89  Resp: '17 17 17   '$ Temp: 98.5 F (36.9 C) 98.4 F (36.9 C) 98.8 F (37.1 C)   TempSrc: Oral Oral Oral   SpO2: 98% 96% 99%   Weight:      Height:        Intake/Output Summary (Last 24 hours) at 03/05/2019 1625 Last data filed at 03/05/2019 1526 Gross per 24 hour  Intake 1873.74 ml  Output 0 ml  Net 1873.74 ml   Filed Weights   03/02/19  1711  Weight: 136 kg    Physical Examination:  General exam: Appears calm and comfortable  Respiratory system: Clear to auscultation. Respiratory effort normal. Cardiovascular system: S1 & S2 heard, RRR. No JVD, murmurs, rubs, gallops or clicks. No pedal edema. Gastrointestinal system: Abdomen is nondistended, soft and nontender. Normal bowel sounds heard. Central nervous system: Alert and oriented. No new focal neurological deficits. Extremities: No contractures, edema or joint deformities.  Skin: No rashes, lesions or ulcers Psychiatry: Judgement and insight appear normal. Mood & affect anxious.   Data Reviewed: I have personally reviewed following labs and imaging studies  CBC: Recent Labs  Lab 02/28/19 0508 03/02/19 1750 03/03/19 0530  WBC 6.6 5.8 6.3  NEUTROABS 4.9 3.4  --   HGB 13.6 13.0 12.3  HCT 41.6 40.9 38.7  MCV 89.8 89.9 91.3  PLT 213 181 993*   Basic Metabolic Panel: Recent Labs  Lab 02/28/19 0508 03/02/19 1750 03/03/19 0530  NA 130* 133* 135  K 4.0 3.6 3.7  CL 97* 99 103  CO2 '23 23 24  '$ GLUCOSE 111* 105* 99  BUN 10 5* 6  CREATININE 0.73 0.70 0.64  CALCIUM 8.3* 8.7* 8.4*   GFR: Estimated Creatinine Clearance: 172.6 mL/min (by C-G formula based on SCr of 0.64 mg/dL). Liver Function Tests: Recent Labs  Lab 02/28/19 0508 03/02/19 1750 03/03/19 0530 03/04/19 0608  AST 132* 250* 225* 197*  ALT 67* 178* 165* 162*  ALKPHOS 88 213* 185* 186*  BILITOT  1.5* 4.3* 3.8* 3.1*  PROT 7.7 7.7 7.0 6.5  ALBUMIN 3.8 3.4* 3.1* 2.9*   Recent Labs  Lab 03/02/19 1750  LIPASE 30   No results for input(s): AMMONIA in the last 168 hours. Coagulation Profile: No results for input(s): INR, PROTIME in the last 168 hours. Cardiac Enzymes: No results for input(s): CKTOTAL, CKMB, CKMBINDEX, TROPONINI in the last 168 hours. BNP (last 3 results) No results for input(s): PROBNP in the last 8760 hours. HbA1C: No results for input(s): HGBA1C in the last 72  hours. CBG: No results for input(s): GLUCAP in the last 168 hours. Lipid Profile: No results for input(s): CHOL, HDL, LDLCALC, TRIG, CHOLHDL, LDLDIRECT in the last 72 hours. Thyroid Function Tests: No results for input(s): TSH, T4TOTAL, FREET4, T3FREE, THYROIDAB in the last 72 hours. Anemia Panel: Recent Labs    03/04/19 0954  FERRITIN 852*   Sepsis Labs: Recent Labs  Lab 03/02/19 1751 03/02/19 1951  LATICACIDVEN 1.1 0.9    Recent Results (from the past 240 hour(s))  Novel Coronavirus, NAA (Hosp order, Send-out to Ref Lab; TAT 18-24 hrs     Status: None   Collection Time: 02/28/19  4:36 AM   Specimen: Nasopharyngeal Swab; Respiratory  Result Value Ref Range Status   SARS-CoV-2, NAA NOT DETECTED NOT DETECTED Final    Comment: (NOTE) This nucleic acid amplification test was developed and its performance characteristics determined by [employer]. Nucleic acid amplification tests include PCR and TMA. This test has not been FDA cleared or approved. This test has been authorized by FDA under an Emergency Use Authorization (EUA). This test is only authorized for the duration of time the declaration that circumstances exist justifying the authorization of the emergency use of in vitro diagnostic tests for detection of SARS-CoV-2 virus and/or diagnosis of COVID-19 infection under section 564(b)(1) of the Act, 21 U.S.C. 096GEZ-6(O) (1), unless the authorization is terminated or revoked sooner. When diagnostic testing is negative, the possibility of a false negative result should be considered in the context of a patient's recent exposures and the presence of clinical signs and symptoms consistent with COVID-19. An individual without symptoms of COVID- 19 and who is not shedding SARS-CoV-2 vi rus would expect to have a negative (not detected) result in this assay. Performed At: [facility] [place], [place] [id number] Rush Farmer MD  KP:[id number]    [place]  Final    Comment: Performed at [facility] [address] [place], [place] [address]  Urine culture     Status: Abnormal   Collection Time: 02/28/19  6:31 AM   Specimen: Urine, Clean Catch  Result Value Ref Range Status   Specimen Description   Final    URINE, CLEAN CATCH Performed at [facility], [place] [address], [place], [place] [address]    Special Requests   Final    NONE Performed at [facility], [place] [address] [place], [place] [address]    Culture MULTIPLE SPECIES PRESENT, SUGGEST RECOLLECTION (A)  Final   Report Status 03/01/2019 FINAL  Final  Respiratory Panel by RT PCR (Flu A&B, Covid) - Nasopharyngeal Swab     Status: None   Collection Time: 03/02/19  6:17 PM   Specimen: Nasopharyngeal Swab  Result Value Ref Range Status   SARS Coronavirus 2 by RT PCR NEGATIVE NEGATIVE Final    Comment: (NOTE) SARS-CoV-2 target nucleic acids are NOT DETECTED. The SARS-CoV-2 RNA is generally detectable in upper respiratoy  specimens during the acute phase of infection. The lowest concentration of SARS-CoV-2 viral copies this assay can detect is 131 copies/mL. A negative result does not preclude SARS-Cov-2 infection and should not be used as the sole basis for treatment or other patient management decisions. A negative result may occur with  improper specimen collection/handling, submission of specimen other than nasopharyngeal swab, presence of viral mutation(s) within the areas targeted by this assay, and inadequate number of viral copies (<131 copies/mL). A negative result must be combined with clinical observations, patient history, and epidemiological information. The expected result is Negative. Fact Sheet for Patients:  PinkCheek.be Fact Sheet for Healthcare Providers:  GravelBags.it This test is not yet ap  proved or cleared by the [place] FDA and  has been authorized for detection and/or diagnosis of SARS-CoV-2 by FDA under an Emergency Use Authorization (EUA). This EUA will remain  in effect (meaning this test can be used) for the duration of the COVID-19 declaration under Section 564(b)(1) of the Act, 21 U.S.C. section 360bbb-3(b)(1), unless the authorization is terminated or revoked sooner.    Influenza A by PCR NEGATIVE NEGATIVE Final   Influenza B by PCR NEGATIVE NEGATIVE Final    Comment: (NOTE) The Xpert Xpress SARS-CoV-2/FLU/RSV assay is intended as an aid in  the diagnosis of influenza from Nasopharyngeal swab specimens and  should not be used as a sole basis for treatment. Nasal washings and  aspirates are unacceptable for Xpert Xpress SARS-CoV-2/FLU/RSV  testing. Fact Sheet for Patients: PinkCheek.be Fact Sheet for Healthcare Providers: GravelBags.it This test is not yet approved or cleared by the [place] FDA and  has been authorized for detection and/or diagnosis of SARS-CoV-2 by  FDA under an Emergency Use Authorization (EUA). This EUA will remain  in effect (meaning this test can be used) for the duration of the  Covid-19 declaration under Section 564(b)(1) of the Act, 21  U.S.C. section 360bbb-3(b)(1), unless the authorization is  terminated or revoked. Performed at [facility], [place] [address], [place], [place] [address]   Blood culture (routine x 2)     Status: None (Preliminary result)   Collection Time: 03/02/19  8:47 PM   Specimen: BLOOD  Result Value Ref Range Status   Specimen Description   Final    BLOOD BLOOD LEFT FOREARM Performed at [facility] [address] [place], [place] [address]    Special Requests   Final    BOTTLES DRAWN AEROBIC AND ANAEROBIC Blood Culture adequate volume Performed at [facility] [address], [place], [place] [address]    Culture   Final    NO GROWTH 3 DAYS Performed at [facility], [place] [address], [place], [place] [address]    Report Status PENDING  Incomplete  Blood culture (routine x 2)     Status: None (Preliminary result)   Collection Time: 03/02/19  8:47 PM   Specimen: BLOOD  Result Value Ref Range Status   Specimen Description   Final    BLOOD BLOOD RIGHT FOREARM Performed at [place] [address] [place], [place] [address]    Special Requests   Final    BOTTLES DRAWN AEROBIC AND ANAEROBIC Blood Culture adequate volume Performed at [facility] [address], [place], [place] [address]    Culture   Final    NO GROWTH 3 DAYS Performed at [facility], [place] [address] [place], [place] [address]  Report Status PENDING  Incomplete  Urine culture     Status: Abnormal   Collection Time: 03/02/19 10:32 PM   Specimen: Urine, Random  Result Value Ref Range Status   Specimen Description   Final    URINE, RANDOM Performed at [facility] [address] [place], [place] [address]    Special Requests   Final    NONE Performed at [facility], [place] [address] [place], [place] [address]    Culture MULTIPLE SPECIES PRESENT, SUGGEST RECOLLECTION (A)  Final   Report Status 03/04/2019 FINAL  Final  CSF culture     Status: None (Preliminary result)   Collection Time: 03/03/19 11:05 AM   Specimen: CSF; Cerebrospinal Fluid  Result Value Ref Range Status   Specimen Description   Final    CSF Performed at [facility] [address], [place], [place] [address]    Special Requests   Final    Normal Performed at [facility], [place] [address] [place], [place] [address]    Gram Stain   Final    NO WBC SEEN NO ORGANISMS SEEN CYTOSPIN Gram Stain Report Called to,Read Back By and Verified With: BLOCK, D. RN 3295 ON 12.21.2020 BY  [facility] Performed at [facility], [place] [address], [place], [place] [address]    Culture   Final    NO GROWTH 2 DAYS Performed at [facility] [address], [place], [place] [address]    Report Status PENDING  Incomplete      Radiology Studies: US Abdomen Limited RUQ  Result Date: 03/04/2019 CLINICAL DATA:  Nausea vomiting, distended gallbladder on CT. EXAM: ULTRASOUND ABDOMEN LIMITED RIGHT UPPER QUADRANT COMPARISON:  CT study 03/02/2019 FINDINGS: Gallbladder: No gallstones or wall thickening visualized. No sonographic Murphy sign noted by sonographer. Common bile duct: Diameter: 4.0 Liver: No focal lesion identified. Within normal limits in parenchymal echogenicity. Portal vein is patent on color Doppler imaging with normal direction of blood flow towards the liver. Other: None. IMPRESSION: Normal right upper quadrant ultrasound. Mildly limited by body habitus. Electronically Signed   By: Zetta Bills M.D.   On: 03/04/2019 19:26     Scheduled Meds: . LORazepam  1 mg Intravenous Once   Continuous Infusions: . sodium chloride 100 mL/hr at 03/05/19 1526  . famotidine (PEPCID) IV Stopped (03/05/19 1332)    Assessment & Plan:   1.  Fever: Patient afebrile in the last 24 hours.  Last temperature spike was 102.22F on 12/21. CSF analysis unremarkable. Doubt meningitis.  D/Ced Vancomycin. Blood and urine cx unremarkable so far (mixed species on urine cx) .  Seen by ID who discontinued Rocephin as well.  She did take Keflex x 2 days at home and been on rocephin for 2 days here.  ESR/C-reactive protein and ferritin modestly elevated at 40, 16 and 852 respectively. Recent Mono test -ve.  Hepatitis panel/HIV unremarkable.COVID X2 neg.  CT abd largely unremarkable. CXR no infiltrates.  Denies any abnormal vaginal discharge, tick bites, recent travel, pets or sick contacts.  Appreciate ID eval-they have sent off EBV, CMV titers along with HIV RNA.Marland Kitchen patient denied having an  IUD, stated she has subcutaneous hormonal implant on left arm.  2.  Positional dizziness, headache, nausea/vomiting :  Patient does not have nystagmus on physical exam.  Denies any tinnitus.  Denies any spinning sensation.  Meclizine did not help much.  Unlikely to be vestibular neuritis.  CT head unremarkable.  Given complaints of headache, will obtain MRI/MRA of the head to rule out any vasculitis/aneurysms/stroke in the setting of OCP use.  Given elevated LFTs, and GB appearance on CT , obtained RUQ USG which is negative as well. IV pepcid added.  PT evaluation requested.  Will repeat orthostatic check.  Continue Toradol IV as needed use and added Fioricet for possible migraine headaches although does not sound typical  3. Elevated LFTs.Hepatitis/HIV/recent Mono test unremarkable.Denies any tick bites/skin rash/abx intolerance.  Likely related to viral syndrome and appears to be downtrending slowly.  ID recommended follow-up as outpatient.   DVT prophylaxis: lovenox Code Status: Full code Family / Patient Communication: d/w patient, mother on the phone while talking to patient Disposition Plan: home when medically clear.d/w ID     LOS: 2 days    Time spent: 35 minutes    Guilford Shi, MD [facility] Pager [phone]  If 7PM-7AM, please contact night-coverage [email or web address] Password [facility] 03/05/2019, 4:25 PM

## 2019-03-05 NOTE — Evaluation (Signed)
Physical Therapy Evaluation Patient Details Name: Daisy Goodman MRN: [id number] DOB: 26-Sep-1995 Today's Date: 03/05/2019   History of Present Illness  Patient is 23 y.o. female admitted for high fever. Pt presented to ED on 12/18/ and then again on 12/20 with headache, high fever, neck discomfort.Patient had a fever of 103.1 on arrival to the hospital. She denies any dysuria, abnormal vaginal discharge or genital/herpetic lesions. She is sexually active with a single partner, last encounter was 2 months back. Denies any known sick contacts. Recieved IV vancomycin, IV ceftriaxone, IV morphine and Tylenol for suspected meningitis.    Clinical Impression  Daisy Goodman is 23 y.o. female admitted with above HPI and diagnosis. Patient is currently limited by functional impairments below (see PT problem list). 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 Orthostatic vitals assessed during therapy, pt denied dizziness but reported some light headed sensation with supine to sit and severe headache with sitting up. Patient will benefit from continued skilled PT interventions to address impairments and progress independence. Acute PT will follow and progress as able.  Orthostatic VS for the past 24 hrs:  BP- Lying Pulse- Lying BP- Sitting Pulse- Sitting BP- Standing at 0 minutes Pulse- Standing at 0 minutes  03/05/19 1434 123/77 84 (!) 111/98 89 112/63 107    Today's Vitals   03/05/19 1447  BP: 100/82  Pulse: 89       Follow Up Recommendations No PT follow up    Equipment Recommendations  None recommended by PT    Recommendations for Other Services       Precautions / Restrictions Restrictions Weight Bearing Restrictions: No      Mobility  Bed Mobility Overal bed mobility: Needs Assistance Bed Mobility: Supine to Sit;Sit to Supine     Supine to sit: Supervision Sit to supine:  Modified independent (Device/Increase time)   General bed mobility comments: pt using significant amount of momentum to sit up in bed, swinging LE's up and down to raise her trunk and swing/pivot to sit EOB. After sittign up for ~ 3 minutes pt requested to return to supine in bed due to persisting headache.  Transfers Overall transfer level: Needs assistance Equipment used: None Transfers: Sit to/from [employer] Sit to Stand: Min guard Stand pivot transfers: Min guard       General transfer comment: pt slightly unsteady with rising, no assist requried for power up. pt agreeable to sti up in chair and min guard provided for stand step transfer as pt remained unsteady and trunk flexed in standing with pt complaining of headache.  Ambulation/Gait      General Gait Details: limited by headache  Stairs            Wheelchair Mobility    Modified Rankin (Stroke Patients Only)       Balance Overall balance assessment: Mild deficits observed, not formally tested                Pertinent Vitals/Pain Pain Assessment: Faces Faces Pain Scale: Hurts worst Pain Location: headache Pain Descriptors / Indicators: Aching;Pounding;Crying Pain Intervention(s): Limited activity within patient's tolerance;Monitored during session;Repositioned    Home Living Family/patient expects to be discharged to:: Private residence Living Arrangements: Non-relatives/Friends(pt is college student living in apartment with 2 roommates, her father lives in [place] and mother is in [place]) Available Help at Discharge: Family(uncertain who/which family or friends would be available) Type  of Home: Apartment Home Access: Stairs to enter Entrance Stairs-Rails: Psychiatric nurse of Steps: ~10 (1 flight, pt lives on 2nd floor of apartmetn building) Home Layout: One level Home Equipment: None      Prior Function Level of Independence: Independent          Comments: 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     Hand Dominance   Dominant Hand: Right    Extremity/Trunk Assessment   Upper Extremity Assessment Upper Extremity Assessment: Overall WFL for tasks assessed    Lower Extremity Assessment Lower Extremity Assessment: Overall WFL for tasks assessed    Cervical / Trunk Assessment Cervical / Trunk Assessment: Normal  Communication   Communication: No difficulties  Cognition Arousal/Alertness: Awake/alert Behavior During Therapy: WFL for tasks assessed/performed Overall Cognitive Status: Within Functional Limits for tasks assessed             General Comments      Exercises     Assessment/Plan    PT Assessment Patient needs continued PT services  PT Problem List Decreased balance;Decreased activity tolerance;Decreased mobility       PT Treatment Interventions Functional mobility training;Balance training;Patient/family education;Therapeutic activities;Therapeutic exercise;Stair training;Gait training    PT Goals (Current goals can be found in the Care Plan section)  Acute Rehab PT Goals Patient Stated Goal: head to stop hurting PT Goal Formulation: With patient Time For Goal Achievement: 03/19/19 Potential to Achieve Goals: Good    Frequency Min 3X/week    AM-PAC PT "6 Clicks" Mobility  Outcome Measure Help needed turning from your back to your side while in a flat bed without using bedrails?: A Little Help needed moving from lying on your back to sitting on the side of a flat bed without using bedrails?: A Little Help needed moving to and from a bed to a chair (including a wheelchair)?: A Little Help needed standing up from a chair using your arms (e.g., wheelchair or bedside chair)?: A Little Help needed to walk in hospital room?: A Little Help needed climbing 3-5 steps with a railing? : A Little 6 Click Score: 18    End of Session   Activity Tolerance: Patient tolerated treatment well Patient left:  in bed;with call bell/phone within reach Nurse Communication: Mobility status;Other (comment)(IV leaking, notified RN) PT Visit Diagnosis: Muscle weakness (generalized) (M62.81);Difficulty in walking, not elsewhere classified (R26.2)    Time: 4166-0630 PT Time Calculation (min) (ACUTE ONLY): 32 min   Charges:   PT Evaluation $PT Eval Low Complexity: 1 Low PT Treatments $Therapeutic Activity: 8-22 mins        Gwynneth Albright PT, DPT Physical Therapist with [facility]  03/05/2019 6:07 PM

## 2019-03-06 ENCOUNTER — Ambulatory Visit (HOSPITAL_COMMUNITY): Admit: 2019-03-06 | Payer: Medicaid Other

## 2019-03-06 LAB — CSF CULTURE W GRAM STAIN
Culture: NO GROWTH
Gram Stain: NONE SEEN
Special Requests: NORMAL

## 2019-03-06 LAB — EBV AB TO VIRAL CAPSID AG PNL, IGG+IGM
EBV VCA IgG: <18 U/mL (ref 0.0–17.9)
EBV VCA IgM: 115 U/mL — ABNORMAL HIGH (ref 0.0–35.9)

## 2019-03-06 LAB — CMV IGM: CMV IgM: <30 AU/mL (ref 0.0–29.9)

## 2019-03-06 MED ORDER — DIAZEPAM 5 MG PO TABS
5.0000 mg | ORAL_TABLET | Freq: Once | ORAL | Status: DC
  Filled 2019-03-06: qty 1

## 2019-03-06 MED ORDER — METOCLOPRAMIDE HCL 5 MG/ML IJ SOLN
10.0000 mg | Freq: Once | INTRAMUSCULAR | Status: AC
  Administered 2019-03-06: 10 mg via INTRAVENOUS
  Filled 2019-03-06: qty 2

## 2019-03-06 MED ORDER — METOCLOPRAMIDE HCL 5 MG/ML IJ SOLN
10.0000 mg | Freq: Three times a day (TID) | INTRAMUSCULAR | Status: DC | PRN
  Administered 2019-03-08: 10 mg via INTRAVENOUS
  Filled 2019-03-06: qty 2

## 2019-03-06 NOTE — Progress Notes (Signed)
[facility] notified of arrival to unit

## 2019-03-06 NOTE — Progress Notes (Signed)
Arrived to 6n26 from [facility] by [facility] at this time

## 2019-03-06 NOTE — Progress Notes (Addendum)
PROGRESS NOTE    Daisy Goodman  KMQ:[id number]  DOB: 12-08-1995  PCP: Trey Sailors, PA Brief summary:Young female who lives in a college apartment with 2 other room mates presented to [facility] ED on 12/20 with headache, high fever, neck discomfort and mild photophobia.Patient had a fever of 103.1 on arrival to the hospital. She denied any dysuria, abnormal vaginal discharge or genital/herpetic lesions. She is sexually active with a single partner, last encounter was 2 months back. Denies any known sick contacts. Recieved IV vancomycin, IV ceftriaxone for suspected meningitis.IR consulted by ER physician for LP and patient underwent procedure on 12/21. Elevated liver enzymes noted on admission labs- CT abdomen showed hepatosteatosis.Patient denies any pain in the right upper quadrant. Hepatitis panel, HIV are negative, blood cx and CSF results unremarkable. COVID -ve on 12/18 and 12/20. Flu -ve.  Hospital course complicated by recurrent temp spike to 102F on 12/22 with persistent headaches/nausea and photophobia.  Seen by ID who also agreed that patient CSF results not consistent with meningitis.  She was felt to have a viral syndrome and recommended outpatient follow-up in their clinic.  Patient was planned for discharge yesterday but she has had continued complaints of positional headache associated with dizziness and nausea.  MRI/MRA of the head was ordered yesterday but could not be done at [facility] due to her size.  Discussed with neurologist on-call Dr. Leonel Ramsay who felt patient may have CSF leak and might benefit from blood patch if MRI negative.  He also recommended adding on MRV to rule out dural vein thrombosis as patient on hormonal therapy.She did have some improvement with Fioricet/Antivert last night.  Repeated treatment this morning and also ordered IV Reglan per neurology suggestion for possible migraine.  Discussed with radiologist on call--Dr. Kathlene Cote who advised, blood patch can be  done only by neuroradiology at [facility]--will transfer today to [facility] bed (discussed with hospitalist Dr. Karleen Hampshire and also updated neurologist Dr. Leonel Ramsay) for MRI studies and possible blood patch.  Updated patient's mother as well.  Objective: Vitals:   03/05/19 1447 03/05/19 2031 03/06/19 0503 03/06/19 1443  BP: 100/82 113/71 124/71 112/73  Pulse: 89 96 92 91  Resp:  _0 Temp:  99.1 F (37.3 C) 99.3 F (37.4 C) 98.8 F (37.1 C)  TempSrc:  Oral Oral Oral  SpO2:  100% 96% 97%  Weight:      Height:        Intake/Output Summary (Last 24 hours) at 03/06/2019 1557 Last data filed at 03/06/2019 0600 Gross per 24 hour  Intake 1041.11 ml  Output --  Net 1041.11 ml   Filed Weights   03/02/19 1711  Weight: 136 kg    Physical Examination:  General exam: Appears tired and continues to complain of headache/nausea/lightheadedness with changing positions Respiratory system: Clear to auscultation. Respiratory effort normal. Cardiovascular system: S1 & S2 heard, RRR. No JVD, murmurs, rubs, gallops or clicks. No pedal edema. Gastrointestinal system: Abdomen is nondistended, soft and nontender. Normal bowel sounds heard. Central nervous system: Alert and oriented. No new focal neurological deficits. Extremities: No contractures, edema or joint deformities.  Skin: No rashes, lesions or ulcers Psychiatry: Judgement and insight appear normal. Mood & affect anxious.   Data Reviewed: I have personally reviewed following labs and imaging studies  CBC: Recent Labs  Lab 02/28/19 0508 03/02/19 1750 03/03/19 0530  WBC 6.6 5.8 6.3  NEUTROABS 4.9 3.4  --   HGB 13.6 13.0 12.3  HCT 41.6  40.9 38.7  MCV 89.8 89.9 91.3  PLT 213 181 510*   Basic Metabolic Panel: Recent Labs  Lab 02/28/19 0508 03/02/19 1750 03/03/19 0530  NA 130* 133* 135  K 4.0 3.6 3.7  CL 97* 99 103  CO2 _0 GLUCOSE 111* 105* 99  BUN 10 5* 6  CREATININE 0.73 0.70 0.64  CALCIUM 8.3* 8.7* 8.4*    GFR: Estimated Creatinine Clearance: 172.6 mL/min (by C-G formula based on SCr of 0.64 mg/dL). Liver Function Tests: Recent Labs  Lab 02/28/19 0508 03/02/19 1750 03/03/19 0530 03/04/19 0608  AST 132* 250* 225* 197*  ALT 67* 178* 165* 162*  ALKPHOS 88 213* 185* 186*  BILITOT 1.5* 4.3* 3.8* 3.1*  PROT 7.7 7.7 7.0 6.5  ALBUMIN 3.8 3.4* 3.1* 2.9*   Recent Labs  Lab 03/02/19 1750  LIPASE 30   No results for input(s): AMMONIA in the last 168 hours. Coagulation Profile: No results for input(s): INR, PROTIME in the last 168 hours. Cardiac Enzymes: No results for input(s): CKTOTAL, CKMB, CKMBINDEX, TROPONINI in the last 168 hours. BNP (last 3 results) No results for input(s): PROBNP in the last 8760 hours. HbA1C: No results for input(s): HGBA1C in the last 72 hours. CBG: No results for input(s): GLUCAP in the last 168 hours. Lipid Profile: No results for input(s): CHOL, HDL, LDLCALC, TRIG, CHOLHDL, LDLDIRECT in the last 72 hours. Thyroid Function Tests: No results for input(s): TSH, T4TOTAL, FREET4, T3FREE, THYROIDAB in the last 72 hours. Anemia Panel: Recent Labs    03/04/19 0954  FERRITIN 852*   Sepsis Labs: Recent Labs  Lab 03/02/19 1751 03/02/19 1951  LATICACIDVEN 1.1 0.9    Recent Results (from the past 240 hour(s))  Novel Coronavirus, NAA (Hosp order, Send-out to Ref Lab; TAT 18-24 hrs     Status: None   Collection Time: 02/28/19  4:36 AM   Specimen: Nasopharyngeal Swab; Respiratory  Result Value Ref Range Status   SARS-CoV-2, NAA NOT DETECTED NOT DETECTED Final    Comment: (NOTE) This nucleic acid amplification test was developed and its performance characteristics determined by [employer]. Nucleic acid amplification tests include PCR and TMA. This test has not been FDA cleared or approved. This test has been authorized by FDA under an Emergency Use Authorization (EUA). This test is only authorized for the duration of time the declaration  that circumstances exist justifying the authorization of the emergency use of in vitro diagnostic tests for detection of SARS-CoV-2 virus and/or diagnosis of COVID-19 infection under section 564(b)(1) of the Act, 21 U.S.C. 258NID-7(O) (1), unless the authorization is terminated or revoked sooner. When diagnostic testing is negative, the possibility of a false negative result should be considered in the context of a patient's recent exposures and the presence of clinical signs and symptoms consistent with COVID-19. An individual without symptoms of COVID- 19 and who is not shedding SARS-CoV-2 vi rus would expect to have a negative (not detected) result in this assay. Performed At: [facility] [place], [place] [id number] Rush Farmer MD ER:[id number]    [place]  Final    Comment: Performed at [facility] [address], [place], [place] [address]  Urine culture     Status: Abnormal   Collection Time: 02/28/19  6:31 AM   Specimen: Urine, Clean Catch  Result Value Ref Range Status   Specimen Description   Final    URINE, CLEAN CATCH Performed at [facility], [place]  [address], [place], [place] [address]    Special Requests   Final    NONE Performed at [facility], [place] [address] [place], [place] [address]    Culture MULTIPLE SPECIES PRESENT, SUGGEST RECOLLECTION (A)  Final   Report Status 03/01/2019 FINAL  Final  Respiratory Panel by RT PCR (Flu A&B, Covid) - Nasopharyngeal Swab     Status: None   Collection Time: 03/02/19  6:17 PM   Specimen: Nasopharyngeal Swab  Result Value Ref Range Status   SARS Coronavirus 2 by RT PCR NEGATIVE NEGATIVE Final    Comment: (NOTE) SARS-CoV-2 target nucleic acids are NOT DETECTED. The SARS-CoV-2 RNA is generally detectable in upper respiratoy specimens during the acute phase of infection. The lowest concentration of SARS-CoV-2 viral  copies this assay can detect is 131 copies/mL. A negative result does not preclude SARS-Cov-2 infection and should not be used as the sole basis for treatment or other patient management decisions. A negative result may occur with  improper specimen collection/handling, submission of specimen other than nasopharyngeal swab, presence of viral mutation(s) within the areas targeted by this assay, and inadequate number of viral copies (<131 copies/mL). A negative result must be combined with clinical observations, patient history, and epidemiological information. The expected result is Negative. Fact Sheet for Patients:  PinkCheek.be Fact Sheet for Healthcare Providers:  GravelBags.it This test is not yet ap proved or cleared by the [place] FDA and  has been authorized for detection and/or diagnosis of SARS-CoV-2 by FDA under an Emergency Use Authorization (EUA). This EUA will remain  in effect (meaning this test can be used) for the duration of the COVID-19 declaration under Section 564(b)(1) of the Act, 21 U.S.C. section 360bbb-3(b)(1), unless the authorization is terminated or revoked sooner.    Influenza A by PCR NEGATIVE NEGATIVE Final   Influenza B by PCR NEGATIVE NEGATIVE Final    Comment: (NOTE) The Xpert Xpress SARS-CoV-2/FLU/RSV assay is intended as an aid in  the diagnosis of influenza from Nasopharyngeal swab specimens and  should not be used as a sole basis for treatment. Nasal washings and  aspirates are unacceptable for Xpert Xpress SARS-CoV-2/FLU/RSV  testing. Fact Sheet for Patients: PinkCheek.be Fact Sheet for Healthcare Providers: GravelBags.it This test is not yet approved or cleared by the [place] FDA and  has been authorized for detection and/or diagnosis of SARS-CoV-2 by  FDA under an Emergency Use Authorization (EUA). This EUA will  remain  in effect (meaning this test can be used) for the duration of the  Covid-19 declaration under Section 564(b)(1) of the Act, 21  U.S.C. section 360bbb-3(b)(1), unless the authorization is  terminated or revoked. Performed at [facility], [place] [address] [place], [place] [address]   Blood culture (routine x 2)     Status: None (Preliminary result)   Collection Time: 03/02/19  8:47 PM   Specimen: BLOOD  Result Value Ref Range Status   Specimen Description   Final    BLOOD BLOOD LEFT FOREARM Performed at [facility] [address] [place], [place] [address]    Special Requests   Final    BOTTLES DRAWN AEROBIC AND ANAEROBIC Blood Culture adequate volume Performed at [facility] [address] [place], [place] [address]    Culture   Final    NO GROWTH 4 DAYS Performed at [facility], [place] [address], [place], [place] [address]    Report Status PENDING  Incomplete  Blood culture (routine x 2)     Status: None (Preliminary result)   Collection Time: 03/02/19  8:47 PM   Specimen: BLOOD  Result Value Ref Range Status   Specimen Description   Final    BLOOD BLOOD RIGHT FOREARM Performed at [facility] [address], [place], [place] [address]    Special Requests   Final    BOTTLES DRAWN AEROBIC AND ANAEROBIC Blood Culture adequate volume Performed at [facility] [address], [place], [place] [address]    Culture   Final    NO GROWTH 4 DAYS Performed at [facility], [place] [address], [place], [place] [address]    Report Status PENDING  Incomplete  Urine culture     Status: Abnormal   Collection Time: 03/02/19 10:32 PM   Specimen: Urine, Random  Result Value Ref Range Status   Specimen Description   Final    URINE, RANDOM Performed at [facility] [address], [place], [place] [address]    Special Requests   Final     NONE Performed at [facility], [place] [address] [place], [place] [address]    Culture MULTIPLE SPECIES PRESENT, SUGGEST RECOLLECTION (A)  Final   Report Status 03/04/2019 FINAL  Final  CSF culture     Status: None   Collection Time: 03/03/19 11:05 AM   Specimen: CSF; Cerebrospinal Fluid  Result Value Ref Range Status   Specimen Description   Final    CSF Performed at [facility] [address], [place], [place] [address]    Special Requests   Final    Normal Performed at [facility], [place] [address] [place], [place] [address]    Gram Stain   Final    NO WBC SEEN NO ORGANISMS SEEN CYTOSPIN Gram Stain Report Called to,Read Back By and Verified With: BLOCK, D. RN 5035 ON 12.21.2020 [facility] Performed at [facility], [place] [address], [place], [place] [address]    Culture   Final    NO GROWTH 3 DAYS Performed at [facility], [place] [address], [place], [place] [address]    Report Status 03/06/2019 FINAL  Final      Radiology Studies: US Abdomen Limited RUQ  Result Date: 03/04/2019 CLINICAL DATA:  Nausea vomiting, distended gallbladder on CT. EXAM: ULTRASOUND ABDOMEN LIMITED RIGHT UPPER QUADRANT COMPARISON:  CT study 03/02/2019 FINDINGS: Gallbladder: No gallstones or wall thickening visualized. No sonographic Murphy sign noted by sonographer. Common bile duct: Diameter: 4.0 Liver: No focal lesion identified. Within normal limits in parenchymal echogenicity. Portal vein is patent on color Doppler imaging with normal direction of blood flow towards the liver. Other: None. IMPRESSION: Normal right upper quadrant ultrasound. Mildly limited by body habitus. Electronically Signed   By: Zetta Bills M.D.   On: 03/04/2019 19:26     Scheduled Meds: . diazepam  5 mg Oral Once   Continuous Infusions: . sodium chloride 100 mL/hr at 03/05/19 1526  . famotidine (PEPCID) IV 20 mg (03/06/19 0943)     Assessment & Plan:   1.  Fever: Patient afebrile in the last 48 hours.  Last temperature spike was 102.9F on 12/21. CSF analysis unremarkable-meningitis felt to be unlikely.  Seen by ID. D/Ced antibiotics including vancomycin/Rocephin. Blood and urine cx unremarkable so far (mixed species on urine cx) .  She did take Keflex x 2 days at home and been on rocephin  for 2 days here.  ESR/C-reactive protein and ferritin modestly elevated at 40, 16 and 852 respectively. Recent Mono test -ve.  Hepatitis panel/HIV unremarkable.COVID X2 neg.  CT abd largely unremarkable. CXR no infiltrates.  Denies any abnormal vaginal discharge, tick bites, recent travel, pets or sick contacts.  Appreciate ID eval-they have sent off EBV, CMV titers along with HIV RNA.  2.  Positional dizziness, headache, nausea/vomiting : Likely post LP spinal headaches as discussed with neurology/radiologist on call.  Patient does not have nystagmus on physical exam.  Denies any tinnitus or spinning sensation.  Meclizine did not help much.  Unlikely to be vestibular neuritis/BPV.  CT head unremarkable.MRI/MRA/MRV scheduled to [facility] at [facility] today at 6 PM.  Given elevated LFTs, and GB appearance on CT , obtained RUQ USG which is negative as well. IV pepcid added.  Continue IV fluids, Toradol IV as needed use and added Fioricet/Reglan for possible migraine headaches although does not sound typical.  May consult neurology at [facility] if needed.  3. Elevated LFTs.Hepatitis/HIV/recent Mono test unremarkable.Denies any tick bites/skin rash/abx intolerance.  Likely related to viral syndrome and appears to be downtrending slowly.  ID recommended follow-up as outpatient.   DVT prophylaxis: SCD Code Status: Full code Family / Patient Communication: d/w patient, called mother and updated regarding transfer Disposition Plan: home when medically clear.     LOS: 3 days    Time spent: 35 minutes       Guilford Shi, MD [facility] Pager [phone]  If 7PM-7AM, please contact night-coverage [email or web address] Password [facility] 03/06/2019, 3:57 PM

## 2019-03-06 NOTE — Progress Notes (Signed)
Physical Therapy Treatment Patient Details Name: ROSILYN COACHMAN MRN: [id number] DOB: August 12, 1995 Today's Date: 03/06/2019    History of Present Illness Patient is 23 y.o. female admitted for high fever. Pt presented to ED on 12/18/ and then again on 12/20 with headache, high fever, neck discomfort.Patient had a fever of 103.1 on arrival to the hospital. She denies any dysuria, abnormal vaginal discharge or genital/herpetic lesions. She is sexually active with a single partner, last encounter was 2 months back. Denies any known sick contacts. Recieved IV vancomycin, IV ceftriaxone, IV morphine and Tylenol for suspected meningitis.    PT Comments    Pt in bed, room dark, breakfast tray untouched.  Assisted OOB to amb a limited distance.  General bed mobility comments: only requires increased time.  General Gait Details: decreased speed for age and slight reliance on IV pole as pt cont to c/o of a headache and light sensivity (hallway much brighter than pt's dark room)  Offered to have pt sit EOB to eat breakfast ans she just was not able to stay upright due to headache with slight dizziness.  Pt layed back down in fetal position.  Pain meds requested.    Follow Up Recommendations  No PT follow up     Equipment Recommendations  None recommended by PT    Recommendations for Other Services       Precautions / Restrictions Precautions Precautions: None Precaution Comments: several days of headache Restrictions Weight Bearing Restrictions: No    Mobility  Bed Mobility Overal bed mobility: Modified Independent       Supine to sit: Modified independent (Device/Increase time) Sit to supine: Modified independent (Device/Increase time)   General bed mobility comments: only requires increased time  Transfers Overall transfer level: Needs assistance Equipment used: None Transfers: Sit to/from [employer] Sit to Stand: Modified independent (Device/Increase  time);Supervision Stand pivot transfers: Modified independent (Device/Increase time);Supervision       General transfer comment: increased time  Ambulation/Gait Ambulation/Gait assistance: Supervision Gait Distance (Feet): 55 Feet Assistive device: IV Pole Gait Pattern/deviations: Step-through pattern Gait velocity: decreased   General Gait Details: decreased speed for age and slight reliance on IV pole as pt cont to c/o of a headache and light sensivity (hallway much brighter than pt's dark room)   Stairs             Wheelchair Mobility    Modified Rankin (Stroke Patients Only)       Balance                                            Cognition Arousal/Alertness: Awake/alert Behavior During Therapy: WFL for tasks assessed/performed Overall Cognitive Status: Within Functional Limits for tasks assessed                                        Exercises      General Comments        Pertinent Vitals/Pain Pain Assessment: Faces Faces Pain Scale: Hurts even more Pain Location: headache Pain Descriptors / Indicators: Aching;Pounding;Constant;Discomfort;Grimacing Pain Intervention(s): Monitored during session    Home Living                      Prior Function  PT Goals (current goals can now be found in the care plan section) Progress towards PT goals: Progressing toward goals    Frequency    Min 3X/week      PT Plan Current plan remains appropriate    Co-evaluation              AM-PAC PT "6 Clicks" Mobility   Outcome Measure  Help needed turning from your back to your side while in a flat bed without using bedrails?: None Help needed moving from lying on your back to sitting on the side of a flat bed without using bedrails?: None Help needed moving to and from a bed to a chair (including a wheelchair)?: None Help needed standing up from a chair using your arms (e.g., wheelchair or  bedside chair)?: None Help needed to walk in hospital room?: A Little Help needed climbing 3-5 steps with a railing? : A Little 6 Click Score: 22    End of Session Equipment Utilized During Treatment: Gait belt Activity Tolerance: Patient limited by pain Patient left: in bed;with call bell/phone within reach Nurse Communication: Mobility status;Other (comment) PT Visit Diagnosis: Muscle weakness (generalized) (M62.81);Difficulty in walking, not elsewhere classified (R26.2)     Time: 0626-9485 PT Time Calculation (min) (ACUTE ONLY): 20 min  Charges:  $Gait Training: 8-22 mins                     Rica Koyanagi  PTA Acute  Rehabilitation Services Pager      [phone] Office      [phone]

## 2019-03-07 ENCOUNTER — Inpatient Hospital Stay (HOSPITAL_COMMUNITY): Payer: Self-pay

## 2019-03-07 DIAGNOSIS — R7982 Elevated C-reactive protein (CRP): Secondary | ICD-10-CM

## 2019-03-07 DIAGNOSIS — G030 Nonpyogenic meningitis: Secondary | ICD-10-CM

## 2019-03-07 DIAGNOSIS — B349 Viral infection, unspecified: Principal | ICD-10-CM

## 2019-03-07 DIAGNOSIS — R700 Elevated erythrocyte sedimentation rate: Secondary | ICD-10-CM

## 2019-03-07 LAB — CBC WITH DIFFERENTIAL/PLATELET
Abs Immature Granulocytes: 0.09 10*3/uL — ABNORMAL HIGH (ref 0.00–0.07)
Basophils Absolute: 0.1 10*3/uL (ref 0.0–0.1)
Basophils Relative: 1 %
Eosinophils Absolute: 0.1 10*3/uL (ref 0.0–0.5)
Eosinophils Relative: 1 %
HCT: 37 % (ref 36.0–46.0)
Hemoglobin: 12.2 g/dL (ref 12.0–15.0)
Immature Granulocytes: 1 %
Lymphocytes Relative: 66 %
Lymphs Abs: 6.6 10*3/uL — ABNORMAL HIGH (ref 0.7–4.0)
MCH: 29.2 pg (ref 26.0–34.0)
MCHC: 33 g/dL (ref 30.0–36.0)
MCV: 88.5 fL (ref 80.0–100.0)
Monocytes Absolute: 0.6 10*3/uL (ref 0.1–1.0)
Monocytes Relative: 6 %
Neutro Abs: 2.5 10*3/uL (ref 1.7–7.7)
Neutrophils Relative %: 25 %
Platelets: 239 10*3/uL (ref 150–400)
RBC: 4.18 MIL/uL (ref 3.87–5.11)
RDW: 15.3 % (ref 11.5–15.5)
WBC: 9.9 10*3/uL (ref 4.0–10.5)
nRBC: 0 % (ref 0.0–0.2)

## 2019-03-07 LAB — COMPREHENSIVE METABOLIC PANEL
ALT: 254 U/L — ABNORMAL HIGH (ref 0–44)
AST: 262 U/L — ABNORMAL HIGH (ref 15–41)
Albumin: 2.8 g/dL — ABNORMAL LOW (ref 3.5–5.0)
Alkaline Phosphatase: 313 U/L — ABNORMAL HIGH (ref 38–126)
Anion gap: 9 (ref 5–15)
BUN: <5 mg/dL — ABNORMAL LOW (ref 6–20)
CO2: 22 mmol/L (ref 22–32)
Calcium: 8.3 mg/dL — ABNORMAL LOW (ref 8.9–10.3)
Chloride: 109 mmol/L (ref 98–111)
Creatinine, Ser: 0.65 mg/dL (ref 0.44–1.00)
GFR calc Af Amer: >60 mL/min (ref >60)
GFR calc non Af Amer: >60 mL/min (ref >60)
Glucose, Bld: 97 mg/dL (ref 70–99)
Potassium: 3.4 mmol/L — ABNORMAL LOW (ref 3.5–5.1)
Sodium: 140 mmol/L (ref 135–145)
Total Bilirubin: 1.9 mg/dL — ABNORMAL HIGH (ref 0.3–1.2)
Total Protein: 6.2 g/dL — ABNORMAL LOW (ref 6.5–8.1)

## 2019-03-07 LAB — C-REACTIVE PROTEIN: CRP: 11 mg/dL — ABNORMAL HIGH (ref <1.0)

## 2019-03-07 LAB — CULTURE, BLOOD (ROUTINE X 2)
Culture: NO GROWTH
Culture: NO GROWTH
Special Requests: ADEQUATE
Special Requests: ADEQUATE

## 2019-03-07 LAB — MAGNESIUM: Magnesium: 1.8 mg/dL (ref 1.7–2.4)

## 2019-03-07 MED ORDER — FAMOTIDINE 10 MG PO TABS
10.0000 mg | ORAL_TABLET | Freq: Every day | ORAL | Status: DC
  Administered 2019-03-07 – 2019-03-08 (×2): 10 mg via ORAL
  Filled 2019-03-07 (×2): qty 1

## 2019-03-07 MED ORDER — GADOBUTROL 1 MMOL/ML IV SOLN
10.0000 mL | Freq: Once | INTRAVENOUS | Status: AC | PRN
  Administered 2019-03-07: 10 mL via INTRAVENOUS

## 2019-03-07 NOTE — Progress Notes (Addendum)
PROGRESS NOTE  Daisy Goodman OAC:[id number] DOB: [birth year] DOA: 03/02/2019 PCP: Trey Sailors, PA  PCP: Trey Sailors, PA Brief summary:Young femalewho lives in a college apartment with 2 other room mates presented to [facility] ED on12/20 with headache, high fever, neck discomfort and mild photophobia.Patient had a fever of 103.1 on arrival to the hospital.She denied any dysuria, abnormal vaginal discharge or genital/herpetic lesions. She is sexually active with a single partner, last encounter was 2 months back. Denies any known sick contacts.Recieved IV vancomycin, IV ceftriaxone for suspected meningitis.IR consulted by ER physician for LPand patient underwent procedure on 12/21. Elevated liver enzymes noted on admission labs-CT abdomen showed hepatosteatosis.Patient denies any pain in the right upper quadrant. Hepatitis panel,HIV are negative,blood cxand CSF results unremarkable. COVID -ve on 12/18 and 12/20.Flu -ve.  Hospital course complicated by recurrent temp spike to 102F on 12/22 with persistent headaches/nausea and photophobia.  Seen by ID who also agreed that patient CSF results not consistent with meningitis.  She was felt to have a viral syndrome and recommended outpatient follow-up in their clinic.  Patient was planned for discharge yesterday but she has had continued complaints of positional headache associated with dizziness and nausea.  MRI/MRA of the head was ordered yesterday but could not be done at [facility] due to her size.  Discussed with neurologist on-call Dr. Leonel Ramsay who felt patient may have CSF leak and might benefit from blood patch if MRI negative.  He also recommended adding on MRV to rule out dural vein thrombosis as patient on hormonal therapy.She did have some improvement with Fioricet/Antivert last night.  Repeated treatment this morning and also ordered IV Reglan per neurology suggestion for possible migraine.  Discussed with radiologist on call--Dr.  Kathlene Cote who advised, blood patch can be done only by neuroradiology at [facility]--will transfer today to [facility] bed (discussed with hospitalist Dr. Karleen Hampshire and also updated neurologist Dr. Leonel Ramsay) for MRI studies and possible blood patch.  Updated patient's mother as well.     HPI/Recap of past 24 hours:  She [facility] at [facility] around 10:30 PM last night T-max 99.6 last 24 hours, she reports feeling a little better, continue feeling nauseous, headache gets worse when she sits up  She reports watery diarrhea x2 last 24 hours, denies abdominal pain Father at bedside  Assessment/Plan: Principal Problem:   High fever Active Problems:   Elevated liver enzymes   Headache   Fever and chills   Fever   Fever, headache, transaminitis , elevated esr/crp (presenting symptom on 12/21) -Covid screening test negative on presentation, she is also tested negative on December 18.  Flu screening is negative on presentation -She received Vanco and ceftriaxone on presentation in the ED, underwent fluoroscopic guided lumbar puncture on December 21. -CSF fluid studies consistent with traumatic tap with 1000 RBC other cells are too few to count, CSF Gram stain and  culture negative for organisms -ID consulted on December 22 think this is likely a viral illness.. ID recommend to stop Antibiotic December 22. And send off CMV EBV HIV testing -HIV screening negative, CMV IgM negative , however she is EBV IgM positive , she also has a mild elevated RMSF IgM , I discussed these results with infectious disease on-call Dr. Johnnye Sima today who recommended add-on ehrlichia testing.  If  She is tested negative for ehrlichia, her presenting symptom is likely related to EBV viral infection. -patient Denies any tick bites/skin rash/abx intolerance Repeat cbc/esr/crp  Transaminitis: -Hepatitis panel  negative, Normal right upper quadrant ultrasound, CT abdomen showed hepatosteatosis otherwise no acute  findings -Likely related ebv, improving  Repeat cmp    Positional dizziness, headache, nausea/vomiting :  Patient does not have nystagmus on physical exam.  Denies any tinnitus.  Denies any spinning sensation.  Meclizine did not help much.  Unlikely to be vestibular neuritis.  CT head unremarkable.   MRI/MRA unremarkable, MRV result pending If results negative , will consult IR for blood patch for possible csf leak.   Class III obesity: Body mass index is 51.85 kg/m. Encourage life style changes   DVT Prophylaxis: scd's  Code Status: full  Family Communication: patient , father at bedside  Disposition Plan: Not ready to discharge   Consultants:  Infectious disease  Dr. Earnest Conroy had phone conversations with neurology Dr. Leonel Ramsay and interventional radiology Dr. Kathlene Cote on December 23  Procedures:  Fluoroscopic guided lumbar puncture on December 21  Antibiotics:  vanc/ Rocephin on presentation, all antibiotics stopped on December 22   Objective: BP 120/78 (BP Location: Left Wrist)   Pulse 82   Temp 99.2 F (37.3 C) (Oral)   Resp 18   Ht 6' (1.829 m)   Wt (!) 173.4 kg   LMP 02/26/2019 (Approximate) Comment: neg preg test done  SpO2 98%   BMI 51.85 kg/m   Intake/Output Summary (Last 24 hours) at 03/07/2019 1238 Last data filed at 03/07/2019 0945 Gross per 24 hour  Intake 1991.19 ml  Output --  Net 1991.19 ml   Filed Weights   03/02/19 1711 03/06/19 2134  Weight: 136 kg (!) 173.4 kg    Exam: Patient is examined daily including today on 03/07/2019, exams remain the same as of yesterday except that has changed    General: She is sleepy, oriented x3, follow command  Cardiovascular: RRR  Respiratory: CTABL  Abdomen: Soft/ND/NT, positive BS  Musculoskeletal: No Edema  Neuro: Sleepy, oriented   Data Reviewed: Basic Metabolic Panel: Recent Labs  Lab 03/02/19 1750 03/03/19 0530  NA 133* 135  K 3.6 3.7  CL 99 103  CO2 23 24  GLUCOSE  105* 99  BUN 5* 6  CREATININE 0.70 0.64  CALCIUM 8.7* 8.4*   Liver Function Tests: Recent Labs  Lab 03/02/19 1750 03/03/19 0530 03/04/19 0608  AST 250* 225* 197*  ALT 178* 165* 162*  ALKPHOS 213* 185* 186*  BILITOT 4.3* 3.8* 3.1*  PROT 7.7 7.0 6.5  ALBUMIN 3.4* 3.1* 2.9*   Recent Labs  Lab 03/02/19 1750  LIPASE 30   No results for input(s): AMMONIA in the last 168 hours. CBC: Recent Labs  Lab 03/02/19 1750 03/03/19 0530  WBC 5.8 6.3  NEUTROABS 3.4  --   HGB 13.0 12.3  HCT 40.9 38.7  MCV 89.9 91.3  PLT 181 147*   Cardiac Enzymes:   No results for input(s): CKTOTAL, CKMB, CKMBINDEX, TROPONINI in the last 168 hours. BNP (last 3 results) No results for input(s): BNP in the last 8760 hours.  ProBNP (last 3 results) No results for input(s): PROBNP in the last 8760 hours.  CBG: No results for input(s): GLUCAP in the last 168 hours.  Recent Results (from the past 240 hour(s))  Novel Coronavirus, NAA (Hosp order, Send-out to Ref Lab; TAT 18-24 hrs     Status: None   Collection Time: 02/28/19  4:36 AM   Specimen: Nasopharyngeal Swab; Respiratory  Result Value Ref Range Status   SARS-CoV-2, NAA NOT DETECTED NOT DETECTED Final    Comment: (NOTE) This  nucleic acid amplification test was developed and its performance characteristics determined by [employer]. Nucleic acid amplification tests include PCR and TMA. This test has not been FDA cleared or approved. This test has been authorized by FDA under an Emergency Use Authorization (EUA). This test is only authorized for the duration of time the declaration that circumstances exist justifying the authorization of the emergency use of in vitro diagnostic tests for detection of SARS-CoV-2 virus and/or diagnosis of COVID-19 infection under section 564(b)(1) of the Act, 21 U.S.C. 169IHW-3(U) (1), unless the authorization is terminated or revoked sooner. When diagnostic testing is negative, the possibility of a  false negative result should be considered in the context of a patient's recent exposures and the presence of clinical signs and symptoms consistent with COVID-19. An individual without symptoms of COVID- 19 and who is not shedding SARS-CoV-2 vi rus would expect to have a negative (not detected) result in this assay. Performed At: [facility] [place], [place] [id number] Rush Farmer MD ZP:[id number]    [place]  Final    Comment: Performed at [facility] [address] [place], [place] [address]  Urine culture     Status: Abnormal   Collection Time: 02/28/19  6:31 AM   Specimen: Urine, Clean Catch  Result Value Ref Range Status   Specimen Description   Final    URINE, CLEAN CATCH Performed at [facility], [place] [address] [place], [place] [address]    Special Requests   Final    NONE Performed at [facility], [place] [address] [place], [place] [address]    Culture MULTIPLE SPECIES PRESENT, SUGGEST RECOLLECTION (A)  Final   Report Status 03/01/2019 FINAL  Final  Respiratory Panel by RT PCR (Flu A&B, Covid) - Nasopharyngeal Swab     Status: None   Collection Time: 03/02/19  6:17 PM   Specimen: Nasopharyngeal Swab  Result Value Ref Range Status   SARS Coronavirus 2 by RT PCR NEGATIVE NEGATIVE Final    Comment: (NOTE) SARS-CoV-2 target nucleic acids are NOT DETECTED. The SARS-CoV-2 RNA is generally detectable in upper respiratoy specimens during the acute phase of infection. The lowest concentration of SARS-CoV-2 viral copies this assay can detect is 131 copies/mL. A negative result does not preclude SARS-Cov-2 infection and should not be used as the sole basis for treatment or other patient management decisions. A negative result may occur with  improper specimen collection/handling, submission of specimen other than nasopharyngeal swab, presence of viral mutation(s)  within the areas targeted by this assay, and inadequate number of viral copies (<131 copies/mL). A negative result must be combined with clinical observations, patient history, and epidemiological information. The expected result is Negative. Fact Sheet for Patients:  PinkCheek.be Fact Sheet for Healthcare Providers:  GravelBags.it This test is not yet ap proved or cleared by the [place] FDA and  has been authorized for detection and/or diagnosis of SARS-CoV-2 by FDA under an Emergency Use Authorization (EUA). This EUA will remain  in effect (meaning this test can be used) for the duration of the COVID-19 declaration under Section 564(b)(1) of the Act, 21 U.S.C. section 360bbb-3(b)(1), unless the authorization is terminated or revoked sooner.    Influenza A by PCR NEGATIVE NEGATIVE Final   Influenza B by PCR NEGATIVE NEGATIVE Final    Comment: (NOTE) The Xpert Xpress SARS-CoV-2/FLU/RSV assay is intended as an aid in  the diagnosis of influenza from Nasopharyngeal swab specimens and  should not be used as a sole basis for treatment. Nasal washings and  aspirates are unacceptable for Xpert Xpress SARS-CoV-2/FLU/RSV  testing. Fact Sheet for Patients: PinkCheek.be Fact Sheet for Healthcare Providers: GravelBags.it This test is not yet approved or cleared by the [place] FDA and  has been authorized for detection and/or diagnosis of SARS-CoV-2 by  FDA under an Emergency Use Authorization (EUA). This EUA will remain  in effect (meaning this test can be used) for the duration of the  Covid-19 declaration under Section 564(b)(1) of the Act, 21  U.S.C. section 360bbb-3(b)(1), unless the authorization is  terminated or revoked. Performed at [facility], [place] [address], [place], [place] [address]   Blood culture (routine x 2)     Status: None    Collection Time: 03/02/19  8:47 PM   Specimen: BLOOD  Result Value Ref Range Status   Specimen Description   Final    BLOOD BLOOD LEFT FOREARM Performed at [facility] [address] [place], [place] [address]    Special Requests   Final    BOTTLES DRAWN AEROBIC AND ANAEROBIC Blood Culture adequate volume Performed at [facility] [address], [place], [place] [address]    Culture   Final    NO GROWTH 5 DAYS Performed at [facility], [place] [address], [place], [place] [address]    Report Status 03/07/2019 FINAL  Final  Blood culture (routine x 2)     Status: None   Collection Time: 03/02/19  8:47 PM   Specimen: BLOOD  Result Value Ref Range Status   Specimen Description   Final    BLOOD BLOOD RIGHT FOREARM Performed at [facility] [address], [place], [place] [address]    Special Requests   Final    BOTTLES DRAWN AEROBIC AND ANAEROBIC Blood Culture adequate volume Performed at [facility] [address], [place], [place] [address]    Culture   Final    NO GROWTH 5 DAYS Performed at [facility], [place] [address], [place], [place] [address]    Report Status 03/07/2019 FINAL  Final  Urine culture     Status: Abnormal   Collection Time: 03/02/19 10:32 PM   Specimen: Urine, Random  Result Value Ref Range Status   Specimen Description   Final    URINE, RANDOM Performed at [facility] [address], [place], [place] [address]    Special Requests   Final    NONE Performed at [facility], [place] [address], [place], [place] [address]    Culture MULTIPLE SPECIES PRESENT, SUGGEST RECOLLECTION (A)  Final   Report Status 03/04/2019 FINAL  Final  CSF culture     Status: None   Collection Time: 03/03/19 11:05 AM   Specimen: CSF; Cerebrospinal Fluid  Result Value Ref Range Status   Specimen Description   Final    CSF Performed at [facility] [address], [place], [place] [address]    Special Requests   Final    Normal Performed at [facility], [place] [address], [place], [place] [address]    Gram Stain   Final    NO WBC SEEN NO ORGANISMS SEEN CYTOSPIN Gram Stain Report Called to,Read Back By and Verified With: BLOCK, D. RN [id number] ON 12.21.2020 BY [facility] Performed at [facility], [place] [address] [place], [place] [address]  Culture   Final    NO GROWTH 3 DAYS Performed at [facility], [place] [address] [place], [place] [address]    Report Status 03/06/2019 FINAL  Final     Studies: MR ANGIO HEAD WO CONTRAST  Result Date: 03/07/2019 CLINICAL DATA:  Headache and dizziness EXAM: MRI HEAD WITHOUT CONTRAST MRA HEAD WITHOUT CONTRAST TECHNIQUE: Multiplanar, multiecho pulse sequences of the brain and surrounding structures were obtained without intravenous contrast. Angiographic images of the head were obtained using MRA technique without contrast. COMPARISON:  None. FINDINGS: MRI HEAD FINDINGS BRAIN: There is no acute infarct, acute hemorrhage or extra-axial collection. The white matter signal is normal for the patient's age. The cerebral and cerebellar volume are age-appropriate. There is no hydrocephalus. Blood-sensitive sequences show no chronic microhemorrhage or superficial siderosis. The midline structures are normal. SKULL AND UPPER CERVICAL SPINE: The visualized skull base, calvarium, upper cervical spine and extracranial soft tissues are normal. SINUSES/ORBITS: No fluid levels or advanced mucosal thickening. No mastoid or middle ear effusion. The orbits are normal. MRA HEAD FINDINGS POSTERIOR CIRCULATION: --Basilar artery: Normal. --Posterior cerebral arteries: Normal. Both originate from the basilar artery. --Superior cerebellar arteries: Normal. --Inferior cerebellar arteries: Normal anterior and posterior inferior cerebellar arteries. ANTERIOR CIRCULATION:  --Intracranial internal carotid arteries: Normal. --Anterior cerebral arteries: Normal. Both A1 segments are present. Patent anterior communicating artery. --Middle cerebral arteries: Normal. --Posterior communicating arteries: Present bilaterally. IMPRESSION: Normal MRI/MRA of the brain. Electronically Signed   By: Ulyses Jarred M.D.   On: 03/07/2019 04:30   MR BRAIN WO CONTRAST  Result Date: 03/07/2019 CLINICAL DATA:  Headache and dizziness EXAM: MRI HEAD WITHOUT CONTRAST MRA HEAD WITHOUT CONTRAST TECHNIQUE: Multiplanar, multiecho pulse sequences of the brain and surrounding structures were obtained without intravenous contrast. Angiographic images of the head were obtained using MRA technique without contrast. COMPARISON:  None. FINDINGS: MRI HEAD FINDINGS BRAIN: There is no acute infarct, acute hemorrhage or extra-axial collection. The white matter signal is normal for the patient's age. The cerebral and cerebellar volume are age-appropriate. There is no hydrocephalus. Blood-sensitive sequences show no chronic microhemorrhage or superficial siderosis. The midline structures are normal. SKULL AND UPPER CERVICAL SPINE: The visualized skull base, calvarium, upper cervical spine and extracranial soft tissues are normal. SINUSES/ORBITS: No fluid levels or advanced mucosal thickening. No mastoid or middle ear effusion. The orbits are normal. MRA HEAD FINDINGS POSTERIOR CIRCULATION: --Basilar artery: Normal. --Posterior cerebral arteries: Normal. Both originate from the basilar artery. --Superior cerebellar arteries: Normal. --Inferior cerebellar arteries: Normal anterior and posterior inferior cerebellar arteries. ANTERIOR CIRCULATION: --Intracranial internal carotid arteries: Normal. --Anterior cerebral arteries: Normal. Both A1 segments are present. Patent anterior communicating artery. --Middle cerebral arteries: Normal. --Posterior communicating arteries: Present bilaterally. IMPRESSION: Normal MRI/MRA of the  brain. Electronically Signed   By: Ulyses Jarred M.D.   On: 03/07/2019 04:30    Scheduled Meds: . diazepam  5 mg Oral Once    Continuous Infusions: . sodium chloride 125 mL/hr at 03/07/19 4920  . famotidine (PEPCID) IV 20 mg (03/07/19 0921)     Time spent: 74mns I have personally reviewed and interpreted on  03/07/2019 daily labs,  imagings as discussed above under date review session and assessment and plans.  I reviewed all nursing notes, pharmacy notes, consultant notes,  vitals, pertinent old records  I have discussed plan of care as described above with RN , patient and family on 03/07/2019   FFlorencia ReasonsMD, PhD, FACP  [facility] Pager [phone] If 7PM-7AM, please  contact night-coverage at [email or web address], password [facility] 03/07/2019, 12:38 PM  LOS: 4 days

## 2019-03-07 NOTE — Plan of Care (Signed)
  Problem: Clinical Measurements: Goal: Respiratory complications will improve Outcome: Progressing Goal: Cardiovascular complication will be avoided Outcome: Progressing   Problem: Pain Managment: Goal: General experience of comfort will improve Outcome: Progressing   Problem: Safety: Goal: Ability to remain free from injury will improve Outcome: Progressing   

## 2019-03-08 ENCOUNTER — Inpatient Hospital Stay (HOSPITAL_COMMUNITY): Payer: Self-pay

## 2019-03-08 ENCOUNTER — Encounter: Payer: Self-pay | Admitting: Radiology

## 2019-03-08 HISTORY — PX: IR FL GUIDED LOC OF NEEDLE/CATH TIP FOR SPINAL INJECTION RT: IMG2397

## 2019-03-08 LAB — CBC WITH DIFFERENTIAL/PLATELET
Abs Immature Granulocytes: 0.11 10*3/uL — ABNORMAL HIGH (ref 0.00–0.07)
Basophils Absolute: 0 10*3/uL (ref 0.0–0.1)
Basophils Relative: 0 %
Eosinophils Absolute: 0.1 10*3/uL (ref 0.0–0.5)
Eosinophils Relative: 1 %
HCT: 38.8 % (ref 36.0–46.0)
Hemoglobin: 12.3 g/dL (ref 12.0–15.0)
Immature Granulocytes: 1 %
Lymphocytes Relative: 67 %
Lymphs Abs: 6.5 10*3/uL — ABNORMAL HIGH (ref 0.7–4.0)
MCH: 28.6 pg (ref 26.0–34.0)
MCHC: 31.7 g/dL (ref 30.0–36.0)
MCV: 90.2 fL (ref 80.0–100.0)
Monocytes Absolute: 0.6 10*3/uL (ref 0.1–1.0)
Monocytes Relative: 6 %
Neutro Abs: 2.4 10*3/uL (ref 1.7–7.7)
Neutrophils Relative %: 25 %
Platelets: 263 10*3/uL (ref 150–400)
RBC: 4.3 MIL/uL (ref 3.87–5.11)
RDW: 15.8 % — ABNORMAL HIGH (ref 11.5–15.5)
WBC: 9.8 10*3/uL (ref 4.0–10.5)
nRBC: 0 % (ref 0.0–0.2)

## 2019-03-08 LAB — COMPREHENSIVE METABOLIC PANEL
ALT: 200 U/L — ABNORMAL HIGH (ref 0–44)
AST: 176 U/L — ABNORMAL HIGH (ref 15–41)
Albumin: 2.8 g/dL — ABNORMAL LOW (ref 3.5–5.0)
Alkaline Phosphatase: 296 U/L — ABNORMAL HIGH (ref 38–126)
Anion gap: 10 (ref 5–15)
BUN: <5 mg/dL — ABNORMAL LOW (ref 6–20)
CO2: 22 mmol/L (ref 22–32)
Calcium: 8.4 mg/dL — ABNORMAL LOW (ref 8.9–10.3)
Chloride: 109 mmol/L (ref 98–111)
Creatinine, Ser: 0.48 mg/dL (ref 0.44–1.00)
GFR calc Af Amer: >60 mL/min (ref >60)
GFR calc non Af Amer: >60 mL/min (ref >60)
Glucose, Bld: 89 mg/dL (ref 70–99)
Potassium: 3.5 mmol/L (ref 3.5–5.1)
Sodium: 141 mmol/L (ref 135–145)
Total Bilirubin: 2.1 mg/dL — ABNORMAL HIGH (ref 0.3–1.2)
Total Protein: 6.4 g/dL — ABNORMAL LOW (ref 6.5–8.1)

## 2019-03-08 LAB — MAGNESIUM: Magnesium: 1.9 mg/dL (ref 1.7–2.4)

## 2019-03-08 LAB — C-REACTIVE PROTEIN: CRP: 8.3 mg/dL — ABNORMAL HIGH (ref <1.0)

## 2019-03-08 MED ORDER — IOPAMIDOL (ISOVUE-M 200) INJECTION 41%
INTRAMUSCULAR | Status: AC
  Filled 2019-03-08: qty 10

## 2019-03-08 MED ORDER — IOPAMIDOL (ISOVUE-M 200) INJECTION 41%
10.0000 mL | Freq: Once | INTRAMUSCULAR | Status: AC
  Administered 2019-03-08: 5 mL via EPIDURAL

## 2019-03-08 MED ORDER — LIDOCAINE HCL (PF) 1 % IJ SOLN
INTRAMUSCULAR | Status: DC | PRN
  Administered 2019-03-08: 5 mL

## 2019-03-08 MED ORDER — LIDOCAINE HCL (PF) 1 % IJ SOLN
INTRAMUSCULAR | Status: AC
  Filled 2019-03-08: qty 30

## 2019-03-08 NOTE — Progress Notes (Signed)
PROGRESS NOTE  CAROLAN AVEDISIAN VFI:[id number] DOB: [birth year] DOA: 03/02/2019 PCP: Trey Sailors, PA  PCP: Trey Sailors, PA Brief summary:Young femalewho lives in a college apartment with 2 other room mates presented to [facility] ED on12/20 with headache, high fever, neck discomfort and mild photophobia.Patient had a fever of 103.1 on arrival to the hospital.She denied any dysuria, abnormal vaginal discharge or genital/herpetic lesions. She is sexually active with a single partner, last encounter was 2 months back. Denies any known sick contacts.Recieved IV vancomycin, IV ceftriaxone for suspected meningitis.IR consulted by ER physician for LPand patient underwent procedure on 12/21. Elevated liver enzymes noted on admission labs-CT abdomen showed hepatosteatosis.Patient denies any pain in the right upper quadrant. Hepatitis panel,HIV are negative,blood cxand CSF results unremarkable. COVID -ve on 12/18 and 12/20.Flu -ve.  Hospital course complicated by recurrent temp spike to 102F on 12/22 with persistent headaches/nausea and photophobia.  Seen by ID who also agreed that patient CSF results not consistent with meningitis.  She was felt to have a viral syndrome and recommended outpatient follow-up in their clinic.  Patient was planned for discharge yesterday but she has had continued complaints of positional headache associated with dizziness and nausea.  MRI/MRA of the head was ordered yesterday but could not be done at [facility] long due to her size.  Discussed with neurologist on-call Dr. Leonel Ramsay who felt patient may have CSF leak and might benefit from blood patch if MRI negative.  He also recommended adding on MRV to rule out dural vein thrombosis as patient on hormonal therapy.She did have some improvement with Fioricet/Antivert last night.  Repeated treatment this morning and also ordered IV Reglan per neurology suggestion for possible migraine.  Discussed with radiologist on call--Dr.  Kathlene Cote who advised, blood patch can be done only by neuroradiology at [facility] campus--will transfer today to [facility] bed (discussed with hospitalist Dr. Karleen Hampshire and also updated neurologist Dr. Leonel Ramsay) for MRI studies and possible blood patch.  Updated patient's mother as well.     HPI/Recap of past 24 hours:   T-max 98.5 last 24 hours,  continue feeling nauseous, headache gets worse when she sits up  She reports moderate amount of watery diarrhea x1 this am, denies abdominal pain Father at bedside  Assessment/Plan: Principal Problem:   High fever Active Problems:   Elevated liver enzymes   Headache   Fever and chills   Fever   Fever, headache, transaminitis , elevated esr/crp (presenting symptom on 12/21) -Covid screening test negative on presentation, she is also tested negative on December 18.  Flu screening is negative on presentation -She received Vanco and ceftriaxone on presentation in the ED, underwent fluoroscopic guided lumbar puncture on December 21. -CSF fluid studies consistent with traumatic tap with 1000 RBC other cells are too few to count, CSF Gram stain and  culture negative for organisms -ID consulted on December 22 think this is likely a viral illness.. ID recommend to stop Antibiotic December 22. And send off CMV EBV HIV testing -HIV screening negative, CMV IgM negative , however she is EBV IgM positive , she also has a mild elevated RMSF IgM , I discussed these results with infectious disease on-call Dr. Johnnye Sima on 12/25 who recommended add-on ehrlichia testing.  If  She is tested negative for ehrlichia, her presenting symptom is likely related to EBV viral infection. -patient Denies any tick bites/skin rash/abx intolerance -Repeat cbc, does show lymphocytosis, no thrombocytopnea - repeat esr/crp  Transaminitis: -Hepatitis panel negative, Normal right  upper quadrant ultrasound, CT abdomen showed hepatosteatosis otherwise no acute findings -Likely  related ebv, improving  -Repeat cmp    Positional dizziness, headache, nausea/vomiting :  Patient does not have nystagmus on physical exam.  Denies any tinnitus.  Denies any spinning sensation.  Meclizine did not help much.  Unlikely to be vestibular neuritis.  CT head unremarkable.   MRI/MRA/MRV unremarkable  consult IR for blood patch for possible csf leak.  Case discussed with IR Dr. Kathlene Cote this morning who also recommended to contact anesthesia for possible blood patch procedure.  I talked to anesthesia on-call Dr. Glennon Mac over the phone. Will follow up on  IR/anesthesia Recommendations regarding timing of the procedure   Class III obesity: Body mass index is 51.85 kg/m. Encourage life style changes   DVT Prophylaxis: scd's  Code Status: full  Family Communication: patient , father at bedside  Disposition Plan: Awaiting for blood patch procedure, likely can go home if her headache improves after the procedure   Consultants:  Infectious disease  Dr. Earnest Conroy had phone conversations with neurology Dr. Leonel Ramsay and interventional radiology Dr. Kathlene Cote on December 23  I talked to Dr. Kathlene Cote on December 26  I talked to anesthesia Dr. Glennon Mac on December 26  Procedures:  Fluoroscopic guided lumbar puncture on December 21  Antibiotics:  vanc/ Rocephin on presentation, all antibiotics stopped on December 22   Objective: BP 131/79 (BP Location: Left Arm)   Pulse 82   Temp 98.5 F (36.9 C) (Oral)   Resp 18   Ht 6' (1.829 m)   Wt (!) 173.4 kg   LMP 02/26/2019 (Approximate) Comment: neg preg test done  SpO2 100%   BMI 51.85 kg/m   Intake/Output Summary (Last 24 hours) at 03/08/2019 0923 Last data filed at 03/07/2019 1300 Gross per 24 hour  Intake 480 ml  Output --  Net 480 ml   Filed Weights   03/02/19 1711 03/06/19 2134  Weight: 136 kg (!) 173.4 kg    Exam: Patient is examined daily including today on 03/08/2019, exams remain the same as of  yesterday except that has changed    General: She is alert, oriented x3, follow command  Cardiovascular: RRR  Respiratory: CTABL  Abdomen: Soft/ND/NT, positive BS  Musculoskeletal: No Edema  Neuro: orientedx3   Data Reviewed: Basic Metabolic Panel: Recent Labs  Lab 03/02/19 1750 03/03/19 0530 03/07/19 1410  NA 133* 135 140  K 3.6 3.7 3.4*  CL 99 103 109  CO2 '23 24 22  '$ GLUCOSE 105* 99 97  BUN 5* 6 <5*  CREATININE 0.70 0.64 0.65  CALCIUM 8.7* 8.4* 8.3*  MG  --   --  1.8   Liver Function Tests: Recent Labs  Lab 03/02/19 1750 03/03/19 0530 03/04/19 0608 03/07/19 1410  AST 250* 225* 197* 262*  ALT 178* 165* 162* 254*  ALKPHOS 213* 185* 186* 313*  BILITOT 4.3* 3.8* 3.1* 1.9*  PROT 7.7 7.0 6.5 6.2*  ALBUMIN 3.4* 3.1* 2.9* 2.8*   Recent Labs  Lab 03/02/19 1750  LIPASE 30   No results for input(s): AMMONIA in the last 168 hours. CBC: Recent Labs  Lab 03/02/19 1750 03/03/19 0530 03/07/19 1410 03/08/19 0826  WBC 5.8 6.3 9.9 PENDING  NEUTROABS 3.4  --  2.5 PENDING  HGB 13.0 12.3 12.2 12.3  HCT 40.9 38.7 37.0 38.8  MCV 89.9 91.3 88.5 90.2  PLT 181 147* 239 263   Cardiac Enzymes:   No results for input(s): CKTOTAL, CKMB, CKMBINDEX, TROPONINI in the  last 168 hours. BNP (last 3 results) No results for input(s): BNP in the last 8760 hours.  ProBNP (last 3 results) No results for input(s): PROBNP in the last 8760 hours.  CBG: No results for input(s): GLUCAP in the last 168 hours.  Recent Results (from the past 240 hour(s))  Novel Coronavirus, NAA (Hosp order, Send-out to Ref Lab; TAT 18-24 hrs     Status: None   Collection Time: 02/28/19  4:36 AM   Specimen: Nasopharyngeal Swab; Respiratory  Result Value Ref Range Status   SARS-CoV-2, NAA NOT DETECTED NOT DETECTED Final    Comment: (NOTE) This nucleic acid amplification test was developed and its performance characteristics determined by [employer]. Nucleic acid amplification tests include  PCR and TMA. This test has not been FDA cleared or approved. This test has been authorized by FDA under an Emergency Use Authorization (EUA). This test is only authorized for the duration of time the declaration that circumstances exist justifying the authorization of the emergency use of in vitro diagnostic tests for detection of SARS-CoV-2 virus and/or diagnosis of COVID-19 infection under section 564(b)(1) of the Act, 21 U.S.C. 570VXB-9(T) (1), unless the authorization is terminated or revoked sooner. When diagnostic testing is negative, the possibility of a false negative result should be considered in the context of a patient's recent exposures and the presence of clinical signs and symptoms consistent with COVID-19. An individual without symptoms of COVID- 19 and who is not shedding SARS-CoV-2 vi rus would expect to have a negative (not detected) result in this assay. Performed At: [facility] [place], [place] [id number] Rush Farmer MD AQ:[id number]    [place]  Final    Comment: Performed at [facility] [address], [place], [place] [address]  Urine culture     Status: Abnormal   Collection Time: 02/28/19  6:31 AM   Specimen: Urine, Clean Catch  Result Value Ref Range Status   Specimen Description   Final    URINE, CLEAN CATCH Performed at [facility], [place] [address], [place], [place] [address]    Special Requests   Final    NONE Performed at [facility], [place] [address] [place], [place] [address]    Culture MULTIPLE SPECIES PRESENT, SUGGEST RECOLLECTION (A)  Final   Report Status 03/01/2019 FINAL  Final  Respiratory Panel by RT PCR (Flu A&B, Covid) - Nasopharyngeal Swab     Status: None   Collection Time: 03/02/19  6:17 PM   Specimen: Nasopharyngeal Swab  Result Value Ref Range Status   SARS Coronavirus 2 by RT PCR NEGATIVE NEGATIVE Final    Comment:  (NOTE) SARS-CoV-2 target nucleic acids are NOT DETECTED. The SARS-CoV-2 RNA is generally detectable in upper respiratoy specimens during the acute phase of infection. The lowest concentration of SARS-CoV-2 viral copies this assay can detect is 131 copies/mL. A negative result does not preclude SARS-Cov-2 infection and should not be used as the sole basis for treatment or other patient management decisions. A negative result may occur with  improper specimen collection/handling, submission of specimen other than nasopharyngeal swab, presence of viral mutation(s) within the areas targeted by this assay, and inadequate number of viral copies (<131 copies/mL). A negative result must be combined with clinical observations, patient history, and epidemiological information. The expected result is Negative. Fact Sheet for Patients:  PinkCheek.be Fact Sheet for Healthcare Providers:  GravelBags.it This test is not yet ap proved or cleared by  the [employer] and  has been authorized for detection and/or diagnosis of SARS-CoV-2 by FDA under an Emergency Use Authorization (EUA). This EUA will remain  in effect (meaning this test can be used) for the duration of the COVID-19 declaration under Section 564(b)(1) of the Act, 21 U.S.C. section 360bbb-3(b)(1), unless the authorization is terminated or revoked sooner.    Influenza A by PCR NEGATIVE NEGATIVE Final   Influenza B by PCR NEGATIVE NEGATIVE Final    Comment: (NOTE) The Xpert Xpress SARS-CoV-2/FLU/RSV assay is intended as an aid in  the diagnosis of influenza from Nasopharyngeal swab specimens and  should not be used as a sole basis for treatment. Nasal washings and  aspirates are unacceptable for Xpert Xpress SARS-CoV-2/FLU/RSV  testing. Fact Sheet for Patients: PinkCheek.be Fact Sheet for Healthcare  Providers: GravelBags.it This test is not yet approved or cleared by the [place] FDA and  has been authorized for detection and/or diagnosis of SARS-CoV-2 by  FDA under an Emergency Use Authorization (EUA). This EUA will remain  in effect (meaning this test can be used) for the duration of the  Covid-19 declaration under Section 564(b)(1) of the Act, 21  U.S.C. section 360bbb-3(b)(1), unless the authorization is  terminated or revoked. Performed at [facility], [place] [address], [place], [place] [address]   Blood culture (routine x 2)     Status: None   Collection Time: 03/02/19  8:47 PM   Specimen: BLOOD  Result Value Ref Range Status   Specimen Description   Final    BLOOD BLOOD LEFT FOREARM Performed at [facility] [address], [place], [place] [address]    Special Requests   Final    BOTTLES DRAWN AEROBIC AND ANAEROBIC Blood Culture adequate volume Performed at [facility] [address] [place], [place] [address]    Culture   Final    NO GROWTH 5 DAYS Performed at [facility], [place] [address] [place], [place] [address]    Report Status 03/07/2019 FINAL  Final  Blood culture (routine x 2)     Status: None   Collection Time: 03/02/19  8:47 PM   Specimen: BLOOD  Result Value Ref Range Status   Specimen Description   Final    BLOOD BLOOD RIGHT FOREARM Performed at [facility] [address] [place], [place] [address]    Special Requests   Final    BOTTLES DRAWN AEROBIC AND ANAEROBIC Blood Culture adequate volume Performed at [facility] [address] [place], [place] [address]    Culture   Final    NO GROWTH 5 DAYS Performed at [facility], [place] [address] [place], [place] [address]    Report Status 03/07/2019 FINAL  Final  Urine culture     Status: Abnormal   Collection Time: 03/02/19 10:32 PM   Specimen: Urine,  Random  Result Value Ref Range Status   Specimen Description   Final    URINE, RANDOM Performed at [facility] [address] [place], [place] [address]    Special Requests   Final    NONE Performed at [facility], [place] [address], [place], [place] [address]    Culture MULTIPLE SPECIES PRESENT, SUGGEST RECOLLECTION (A)  Final   Report Status 03/04/2019 FINAL  Final  CSF culture     Status: None   Collection Time: 03/03/19 11:05 AM   Specimen: CSF; Cerebrospinal  Fluid  Result Value Ref Range Status   Specimen Description   Final    CSF Performed at [facility] [address], [place], [place] [address]    Special Requests   Final    Normal Performed at [facility], [place] [address] [place], [place] [address]    Gram Stain   Final    NO WBC SEEN NO ORGANISMS SEEN CYTOSPIN Gram Stain Report Called to,Read Back By and Verified With: BLOCK, D. RN [id number] ON 12.21.2020 BY [facility] Performed at [facility], [place] [address], [place], [place] [address]    Culture   Final    NO GROWTH 3 DAYS Performed at [facility], [place] [address] [place],  [address]    Report Status 03/06/2019 FINAL  Final     Studies: MR MRV HEAD W WO CONTRAST  Result Date: 03/07/2019 CLINICAL DATA:  Headache and dizziness. EXAM: MR VENOGRAM HEAD WITHOUT AND WITH CONTRAST TECHNIQUE: Angiographic images of the intracranial venous structures were obtained using MRV technique without and with intravenous contrast. COMPARISON:  MRI and MRA head without contrast 03/07/2019 FINDINGS: The dural sinuses are patent. The right transverse sinus is dominant. Left sigmoid sinus is patent. The straight sinus deep cerebral veins are intact. Cortical veins are normal. No thrombus is present. IMPRESSION: Negative MRV of the head. Electronically Signed   By: San Morelle M.D.   On: 03/07/2019 15:21    Scheduled  Meds: . diazepam  5 mg Oral Once  . famotidine  10 mg Oral QHS    Continuous Infusions: . sodium chloride 125 mL/hr at 03/08/19 0254     Time spent: 54mns I have personally reviewed and interpreted on  03/08/2019 daily labs,  imagings as discussed above under date review session and assessment and plans.  I reviewed all nursing notes, pharmacy notes, consultant notes,  vitals, pertinent old records  I have discussed plan of care as described above with RN , patient and family on 03/08/2019   FFlorencia ReasonsMD, PhD, FACP  [facility]  [email or web address], password [email or web address] Hospital Of [facility] East12/26/2020, 9:23 AM  LOS: 5 days

## 2019-03-08 NOTE — Procedures (Signed)
Fluoroscopic guided blood patch @ left L2/3 interlaminar epidural space. 61ml of the patient's blood No complications.

## 2019-03-08 NOTE — Plan of Care (Signed)

## 2019-03-09 LAB — COMPREHENSIVE METABOLIC PANEL
ALT: 154 U/L — ABNORMAL HIGH (ref 0–44)
AST: 131 U/L — ABNORMAL HIGH (ref 15–41)
Albumin: 2.6 g/dL — ABNORMAL LOW (ref 3.5–5.0)
Alkaline Phosphatase: 287 U/L — ABNORMAL HIGH (ref 38–126)
Anion gap: 10 (ref 5–15)
BUN: <5 mg/dL — ABNORMAL LOW (ref 6–20)
CO2: 21 mmol/L — ABNORMAL LOW (ref 22–32)
Calcium: 8.4 mg/dL — ABNORMAL LOW (ref 8.9–10.3)
Chloride: 109 mmol/L (ref 98–111)
Creatinine, Ser: 0.57 mg/dL (ref 0.44–1.00)
GFR calc Af Amer: >60 mL/min (ref >60)
GFR calc non Af Amer: >60 mL/min (ref >60)
Glucose, Bld: 88 mg/dL (ref 70–99)
Potassium: 3.7 mmol/L (ref 3.5–5.1)
Sodium: 140 mmol/L (ref 135–145)
Total Bilirubin: 1.3 mg/dL — ABNORMAL HIGH (ref 0.3–1.2)
Total Protein: 6.1 g/dL — ABNORMAL LOW (ref 6.5–8.1)

## 2019-03-09 LAB — C-REACTIVE PROTEIN: CRP: 6.6 mg/dL — ABNORMAL HIGH (ref <1.0)

## 2019-03-09 LAB — SEDIMENTATION RATE: Sed Rate: 36 mm/hr — ABNORMAL HIGH (ref 0–22)

## 2019-03-09 NOTE — Progress Notes (Signed)
Pt discharged home in stable condition 

## 2019-03-09 NOTE — Discharge Summary (Signed)
Discharge Summary  Daisy Goodman BWG:[id number] DOB: Feb 25, 1996  PCP: Trey Sailors, PA  Admit date: 03/02/2019 Discharge date: 03/09/2019  Time spent: 27mns  Recommendations for Outpatient Follow-up:  1. F/u with PCP within a week  for hospital discharge follow up 2. F/u with infectious disease on 12/29 as already scheduled. release back to work note per infectious disease. ID to follow up on final Ehrlichia antibody panel result  Discharge Diagnoses:  Active Hospital Problems   Diagnosis Date Noted  . High fever 03/03/2019  . Elevated liver enzymes 03/03/2019  . Headache 03/03/2019  . Fever and chills 03/03/2019  . Fever 03/03/2019    Resolved Hospital Problems  No resolved problems to display.    Discharge Condition: stable  Diet recommendation: regular diet  Filed Weights   03/02/19 1711 03/06/19 2134  Weight: 136 kg (!) 173.4 kg    History of present illness: (per admitting MD Dr KHumphrey Rolls Chief Complaint: Fever and headache  HPI: Daisy GLENNIEis a 23y.o. female with medical history significant of asthma, dysmenorrhea and obesity presented to ED for evaluation of fever, headache and worsening generalized weakness.  Patient states that she started feeling sick since last week and the problem started with fever, chills and pressure on her head.  Patient further states that she came to the ED 2 days ago for same problem and she was diagnosed with UTI and discharged to home with antibiotics for UTI but her condition continue to worsen and now she is having severe neck discomfort with moving her neck.  Patient explains headache as a severe and constant discomfort sensation in the head that worsens with fever.  Patient otherwise denies photophobia, blurriness of vision, cough, loss of taste and smell sensation, chest pain, shortness of breath, nausea, vomiting, abdominal pain, urinary symptoms and anxiety.   ED Course: In ED on arrival patient had temperature of  103.1, heart rate 125, respiratory rate 18 and a blood pressure of 155 481 well oxygen saturation 99% on room air.  WBC count within normal limits, hemoglobin 13 elevated liver enzymes.  CT scan of her head was negative for acute intracranial bleed or pathology.  CT abdomen/pelvis was done and it shows fatty liver.  The ED patient was managed with IV vancomycin, IV ceftriaxone, morphine and Tylenol for suspected meningitis.  In the ED, lumbar puncture was tried but failed twice and interventional radiology is consulted by ED physician for lumbar puncture.   Hospital Course:  Principal Problem:   High fever Active Problems:   Elevated liver enzymes   Headache   Fever and chills   Fever   Fever, headache, transaminitis , elevated esr/crp (presenting symptom on 12/21) -Covid screening test negative on presentation, she is also tested negative on December 18.  Flu screening is negative on presentation -She received Vanco and ceftriaxone on presentation in the ED, underwent fluoroscopic guided lumbar puncture on December 21. -CSF fluid studies consistent with traumatic tap with 1000 RBC other cells are too few to count, CSF Gram stain and  culture negative for organisms -ID consulted on December 22 think this is likely a viral illness.. ID recommend to stop Antibiotic December 22. And send off CMV EBV HIV testing -HIV screening negative, CMV IgM negative , however she is EBV IgM positive , she also has a mild elevated RMSF IgM , I discussed these results with infectious disease on-call Dr. HJohnnye Simaon 12/25 who recommended add-on ehrlichia testing.  If  She is tested  negative for ehrlichia, her presenting symptom is likely related to EBV viral infection. -patient Denies any tick bites/skin rash/abx intolerance -Repeat cbc, does show lymphocytosis, no thrombocytopnea - repeat esr/crp is decreasing -she also reports some diarrhea which is resolving as well, suspect diarrhea is related to EBV as  well She is to follow up with infectious disease on 12/29 for hospital discharge follow up.  Transaminitis: -Hepatitis panel negative, Normal right upper quadrant ultrasound, CT abdomen showed hepatosteatosis otherwise no acute findings -Likely related ebv - improving, lft trending down     Positional dizziness, headache,nausea/vomiting : Patient does not have nystagmus on physical exam. Denies any tinnitus. Denies any spinning sensation. Meclizine did not help much. Unlikely to be vestibular neuritis.  CT head unremarkable.  MRI/MRA/MRV unremarkable  patient symptom resolved after Fluoroscopic guided blood patch @ left L2/3 interlaminar epidural space done on 12/26 by IR Dr Jobe Igo. Per Dr Jobe Igo, patient is advised to lay flat 24hrs after the procedure as much as possible. Patient reports no headache when gets up use the bathroom this am, she is stable to discharge home.   Class III obesity: Body mass index is 51.85 kg/m. Encourage life style changes   DVT Prophylaxis: scd's  Code Status: full  Family Communication: patient   Disposition Plan: home   Consultants:  Infectious disease  Dr. Earnest Conroy had phone conversations with neurology Dr. Leonel Ramsay and interventional radiology Dr. Kathlene Cote on December 23  IR Dr Lawrence Santiago   Procedures: Fluoroscopic guided lumbar puncture on December 21 Fluoroscopic guided blood patch @ left L2/3 interlaminar epidural space on 12/26 by Dr Jobe Igo  Antibiotics:  vanc/ Rocephin on presentation, all antibiotics stopped on December 22   Discharge Exam: BP 116/71 (BP Location: Left Wrist)   Pulse 83   Temp 98.3 F (36.8 C) (Oral)   Resp 18   Ht 6' (1.829 m)   Wt (!) 173.4 kg   LMP 02/26/2019 (Approximate) Comment: neg preg test done  SpO2 99%   BMI 51.85 kg/m   General: NAD Cardiovascular: RRR Respiratory: CTABL  Discharge Instructions You were cared for by a hospitalist during your  hospital stay. If you have any questions about your discharge medications or the care you received while you were in the hospital after you are discharged, you can call the unit and asked to speak with the hospitalist on call if the hospitalist that took care of you is not available. Once you are discharged, your primary care physician will handle any further medical issues. Please note that NO REFILLS for any discharge medications will be authorized once you are discharged, as it is imperative that you return to your primary care physician (or establish a relationship with a primary care physician if you do not have one) for your aftercare needs so that they can reassess your need for medications and monitor your lab values.  Discharge Instructions    Diet general   Complete by: As directed    Increase activity slowly   Complete by: As directed      Allergies as of 03/09/2019   No Known Allergies     Medication List    STOP taking these medications   acetaminophen 500 MG tablet Commonly known as: TYLENOL   albuterol 108 (90 Base) MCG/ACT inhaler Commonly known as: VENTOLIN HFA   cephALEXin 500 MG capsule Commonly known as: KEFLEX   cetirizine 10 MG tablet Commonly known as: ZYRTEC   ondansetron 4 MG disintegrating tablet Commonly known as: Zofran ODT  TAKE these medications   NEXPLANON [place] Inject into the skin.      No Known Allergies Follow-up Information    [facility] Follow up in 1 week(s).   Why: for primary care services, for hospital discharge follow up Contact information: [address] [place] [address] [phone]           The results of significant diagnostics from this hospitalization (including imaging, microbiology, ancillary and laboratory) are listed below for reference.    Significant Diagnostic Studies: CT Head Wo Contrast  Result Date: 03/02/2019 CLINICAL DATA:  Headache EXAM: CT  HEAD WITHOUT CONTRAST TECHNIQUE: Contiguous axial images were obtained from the base of the skull through the vertex without intravenous contrast. COMPARISON:  None. FINDINGS: Brain: There is no mass, hemorrhage or extra-axial collection. The size and configuration of the ventricles and extra-axial CSF spaces are normal. The brain parenchyma is normal, without acute or chronic infarction. Vascular: No abnormal hyperdensity of the major intracranial arteries or dural venous sinuses. No intracranial atherosclerosis. Skull: The visualized skull base, calvarium and extracranial soft tissues are normal. Sinuses/Orbits: No fluid levels or advanced mucosal thickening of the visualized paranasal sinuses. No mastoid or middle ear effusion. The orbits are normal. IMPRESSION: Normal head CT. Electronically Signed   By: Ulyses Jarred M.D.   On: 03/02/2019 19:16   MR ANGIO HEAD WO CONTRAST  Result Date: 03/07/2019 CLINICAL DATA:  Headache and dizziness EXAM: MRI HEAD WITHOUT CONTRAST MRA HEAD WITHOUT CONTRAST TECHNIQUE: Multiplanar, multiecho pulse sequences of the brain and surrounding structures were obtained without intravenous contrast. Angiographic images of the head were obtained using MRA technique without contrast. COMPARISON:  None. FINDINGS: MRI HEAD FINDINGS BRAIN: There is no acute infarct, acute hemorrhage or extra-axial collection. The white matter signal is normal for the patient's age. The cerebral and cerebellar volume are age-appropriate. There is no hydrocephalus. Blood-sensitive sequences show no chronic microhemorrhage or superficial siderosis. The midline structures are normal. SKULL AND UPPER CERVICAL SPINE: The visualized skull base, calvarium, upper cervical spine and extracranial soft tissues are normal. SINUSES/ORBITS: No fluid levels or advanced mucosal thickening. No mastoid or middle ear effusion. The orbits are normal. MRA HEAD FINDINGS POSTERIOR CIRCULATION: --Basilar artery: Normal.  --Posterior cerebral arteries: Normal. Both originate from the basilar artery. --Superior cerebellar arteries: Normal. --Inferior cerebellar arteries: Normal anterior and posterior inferior cerebellar arteries. ANTERIOR CIRCULATION: --Intracranial internal carotid arteries: Normal. --Anterior cerebral arteries: Normal. Both A1 segments are present. Patent anterior communicating artery. --Middle cerebral arteries: Normal. --Posterior communicating arteries: Present bilaterally. IMPRESSION: Normal MRI/MRA of the brain. Electronically Signed   By: Ulyses Jarred M.D.   On: 03/07/2019 04:30   MR BRAIN WO CONTRAST  Result Date: 03/07/2019 CLINICAL DATA:  Headache and dizziness EXAM: MRI HEAD WITHOUT CONTRAST MRA HEAD WITHOUT CONTRAST TECHNIQUE: Multiplanar, multiecho pulse sequences of the brain and surrounding structures were obtained without intravenous contrast. Angiographic images of the head were obtained using MRA technique without contrast. COMPARISON:  None. FINDINGS: MRI HEAD FINDINGS BRAIN: There is no acute infarct, acute hemorrhage or extra-axial collection. The white matter signal is normal for the patient's age. The cerebral and cerebellar volume are age-appropriate. There is no hydrocephalus. Blood-sensitive sequences show no chronic microhemorrhage or superficial siderosis. The midline structures are normal. SKULL AND UPPER CERVICAL SPINE: The visualized skull base, calvarium, upper cervical spine and extracranial soft tissues are normal. SINUSES/ORBITS: No fluid levels or advanced mucosal thickening. No mastoid or middle ear effusion. The  orbits are normal. MRA HEAD FINDINGS POSTERIOR CIRCULATION: --Basilar artery: Normal. --Posterior cerebral arteries: Normal. Both originate from the basilar artery. --Superior cerebellar arteries: Normal. --Inferior cerebellar arteries: Normal anterior and posterior inferior cerebellar arteries. ANTERIOR CIRCULATION: --Intracranial internal carotid arteries: Normal.  --Anterior cerebral arteries: Normal. Both A1 segments are present. Patent anterior communicating artery. --Middle cerebral arteries: Normal. --Posterior communicating arteries: Present bilaterally. IMPRESSION: Normal MRI/MRA of the brain. Electronically Signed   By: Ulyses Jarred M.D.   On: 03/07/2019 04:30   CT ABDOMEN PELVIS W CONTRAST  Result Date: 03/03/2019 CLINICAL DATA:  Fever of unknown origin. Headache for 1 week. EXAM: CT ABDOMEN AND PELVIS WITH CONTRAST TECHNIQUE: Multidetector CT imaging of the abdomen and pelvis was performed using the standard protocol following bolus administration of intravenous contrast. CONTRAST:  139m OMNIPAQUE IOHEXOL 300 MG/ML  SOLN COMPARISON:  None. FINDINGS: Lower chest: The lung bases are clear. No focal airspace disease. No pleural fluid. Hepatobiliary: Decreased hepatic density consistent with steatosis. No focal hepatic abnormality. Gallbladder physiologically distended, no calcified stone. No biliary dilatation. Pancreas: No ductal dilatation or inflammation. Spleen: Normal in size spanning 12.2 cm AP. No focal abnormality. Adrenals/Urinary Tract: Normal adrenal glands. 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 No hydronephrosis of either kidney. No perinephric edema. No visualized renal stones. Urinary bladder is physiologically distended. No bladder wall thickening. Stomach/Bowel: Nondistended stomach. No bowel obstruction or inflammation. Normal appendix. No evidence of bowel wall thickening. Colon is decompressed. No colonic wall thickening. Vascular/Lymphatic: No acute vascular findings. The abdominal aorta and IVC are intact. No evidence of portal vein thrombosis, evaluation slightly limited by phase of contrast. Small retroperitoneal nodes not enlarged by size criteria. Few prominent bilateral inguinal nodes are nonspecific but likely reactive. Reproductive: Uterus and bilateral adnexa are unremarkable. Other: No free air, free fluid,  or intra-abdominal fluid collection. Small fat containing umbilical hernia. Musculoskeletal: There are no acute or suspicious osseous abnormalities. IMPRESSION: 1. No acute abnormality or explanation for abdominal pain. 2. 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 3. Hepatic steatosis. Electronically Signed   By: MKeith RakeM.D.   On: 03/03/2019 00:09   IR FL GUIDED LOC OF NEEDLE/CATH TIP FOR SPINAL INJECT RT  Result Date: 03/08/2019 MSan Morelle MD     03/08/2019  5:14 PM Fluoroscopic guided blood patch @ left L2/3 interlaminar epidural space. 271mof the patient's blood No complications.  DG Chest Portable 1 View  Result Date: 03/02/2019 CLINICAL DATA:  Fever and headache EXAM: PORTABLE CHEST 1 VIEW COMPARISON:  February 28, 2019 FINDINGS: The heart size and mediastinal contours are mildly prominent as on prior exam. Both lungs are clear. The visualized skeletal structures are unremarkable. IMPRESSION: No active disease. Electronically Signed   By: BiPrudencio Pair.D.   On: 03/02/2019 18:29   CXR Port 1 View - chest pain  Result Date: 02/28/2019 CLINICAL DATA:  Fever and body aches. EXAM: PORTABLE CHEST 1 VIEW COMPARISON:  None. FINDINGS: The cardiomediastinal contours are normal. Upper normal heart size, particularly for age. Minimal streaky bibasilar opacities. Pulmonary vasculature is normal. No pleural effusion or pneumothorax. No acute osseous abnormalities are seen. Soft tissue attenuation from habitus limits assessment. IMPRESSION: 1. Minimal streaky bibasilar opacities, favor atelectasis over pneumonia 2. Upper normal heart size, particularly for age. Electronically Signed   By: MeKeith Rake.D.   On: 02/28/2019 04:55   MR MRV HEAD W WO CONTRAST  Result Date: 03/07/2019 CLINICAL DATA:  Headache and dizziness.  EXAM: MR VENOGRAM HEAD WITHOUT AND WITH CONTRAST TECHNIQUE: Angiographic images of the intracranial venous structures were obtained  using MRV technique without and with intravenous contrast. COMPARISON:  MRI and MRA head without contrast 03/07/2019 FINDINGS: The dural sinuses are patent. The right transverse sinus is dominant. Left sigmoid sinus is patent. The straight sinus deep cerebral veins are intact. Cortical veins are normal. No thrombus is present. IMPRESSION: Negative MRV of the head. Electronically Signed   By: San Morelle M.D.   On: 03/07/2019 15:21   US Abdomen Limited RUQ  Result Date: 03/04/2019 CLINICAL DATA:  Nausea vomiting, distended gallbladder on CT. EXAM: ULTRASOUND ABDOMEN LIMITED RIGHT UPPER QUADRANT COMPARISON:  CT study 03/02/2019 FINDINGS: Gallbladder: No gallstones or wall thickening visualized. No sonographic Murphy sign noted by sonographer. Common bile duct: Diameter: 4.0 Liver: No focal lesion identified. Within normal limits in parenchymal echogenicity. Portal vein is patent on color Doppler imaging with normal direction of blood flow towards the liver. Other: None. IMPRESSION: Normal right upper quadrant ultrasound. Mildly limited by body habitus. Electronically Signed   By: Zetta Bills M.D.   On: 03/04/2019 19:26   DG Lumbar Puncture Fluoro Guide  Result Date: 03/03/2019 CLINICAL DATA:  Headache, fever, concern for meningitis EXAM: DIAGNOSTIC LUMBAR PUNCTURE UNDER FLUOROSCOPIC GUIDANCE FLUOROSCOPY TIME:  Radiation Exposure Index (as provided by the fluoroscopic device): 3 minutes 18 seconds If the device does not provide the exposure index: Fluoroscopy Time (in minutes and seconds):  116 mGy Number of Acquired Images:  1 PROCEDURE: Informed consent was obtained from the patient prior to the procedure, including potential complications of headache, allergy, and pain. With the patient prone, the lower back was prepped with Betadine. 1% Lidocaine was used for local anesthesia. Lumbar puncture was performed at the L3-L4 level using a 20 gauge needle with return of clear CSF with an opening  pressure of 15 cm water. Five ml of CSF were obtained for laboratory studies. The patient tolerated the procedure well and there were no apparent complications. The CSF was under very low pressure therefore low volume collection. IMPRESSION: 1. Successful lumbar puncture Electronically Signed   By: Suzy Bouchard M.D.   On: 03/03/2019 12:10    Microbiology: Recent Results (from the past 240 hour(s))  Novel Coronavirus, NAA (Hosp order, Send-out to Ref Lab; TAT 18-24 hrs     Status: None   Collection Time: 02/28/19  4:36 AM   Specimen: Nasopharyngeal Swab; Respiratory  Result Value Ref Range Status   SARS-CoV-2, NAA NOT DETECTED NOT DETECTED Final    Comment: (NOTE) This nucleic acid amplification test was developed and its performance characteristics determined by [employer]. Nucleic acid amplification tests include PCR and TMA. This test has not been FDA cleared or approved. This test has been authorized by FDA under an Emergency Use Authorization (EUA). This test is only authorized for the duration of time the declaration that circumstances exist justifying the authorization of the emergency use of in vitro diagnostic tests for detection of SARS-CoV-2 virus and/or diagnosis of COVID-19 infection under section 564(b)(1) of the Act, 21 U.S.C. 300PQZ-3(A) (1), unless the authorization is terminated or revoked sooner. When diagnostic testing is negative, the possibility of a false negative result should be considered in the context of a patient's recent exposures and the presence of clinical signs and symptoms consistent with COVID-19. An individual without symptoms of COVID- 19 and who is not shedding SARS-CoV-2 vi rus would expect to have a negative (not detected) result in  this assay. Performed At: [facility] [place], [place] [id number] Rush Farmer MD YQ:[id number]    [place]  Final    Comment: Performed at [facility] [address] [place], [place] [address]  Urine culture     Status: Abnormal   Collection Time: 02/28/19  6:31 AM   Specimen: Urine, Clean Catch  Result Value Ref Range Status   Specimen Description   Final    URINE, CLEAN CATCH Performed at [facility], [place] [address], [place], [place] [address]    Special Requests   Final    NONE Performed at [facility], [place] [address], [place], [place] [address]    Culture MULTIPLE SPECIES PRESENT, SUGGEST RECOLLECTION (A)  Final   Report Status 03/01/2019 FINAL  Final  Respiratory Panel by RT PCR (Flu A&B, Covid) - Nasopharyngeal Swab     Status: None   Collection Time: 03/02/19  6:17 PM   Specimen: Nasopharyngeal Swab  Result Value Ref Range Status   SARS Coronavirus 2 by RT PCR NEGATIVE NEGATIVE Final    Comment: (NOTE) SARS-CoV-2 target nucleic acids are NOT DETECTED. The SARS-CoV-2 RNA is generally detectable in upper respiratoy specimens during the acute phase of infection. The lowest concentration of SARS-CoV-2 viral copies this assay can detect is 131 copies/mL. A negative result does not preclude SARS-Cov-2 infection and should not be used as the sole basis for treatment or other patient management decisions. A negative result may occur with  improper specimen collection/handling, submission of specimen other than nasopharyngeal swab, presence of viral mutation(s) within the areas targeted by this assay, and inadequate number of viral copies (<131 copies/mL). A negative result must be combined with clinical observations, patient history, and epidemiological information. The expected result is Negative. Fact Sheet for Patients:  PinkCheek.be Fact Sheet for Healthcare Providers:  GravelBags.it This test is not yet ap proved or cleared by the [place] FDA and  has been authorized for detection and/or  diagnosis of SARS-CoV-2 by FDA under an Emergency Use Authorization (EUA). This EUA will remain  in effect (meaning this test can be used) for the duration of the COVID-19 declaration under Section 564(b)(1) of the Act, 21 U.S.C. section 360bbb-3(b)(1), unless the authorization is terminated or revoked sooner.    Influenza A by PCR NEGATIVE NEGATIVE Final   Influenza B by PCR NEGATIVE NEGATIVE Final    Comment: (NOTE) The Xpert Xpress SARS-CoV-2/FLU/RSV assay is intended as an aid in  the diagnosis of influenza from Nasopharyngeal swab specimens and  should not be used as a sole basis for treatment. Nasal washings and  aspirates are unacceptable for Xpert Xpress SARS-CoV-2/FLU/RSV  testing. Fact Sheet for Patients: PinkCheek.be Fact Sheet for Healthcare Providers: GravelBags.it This test is not yet approved or cleared by the [place] FDA and  has been authorized for detection and/or diagnosis of SARS-CoV-2 by  FDA under an Emergency Use Authorization (EUA). This EUA will remain  in effect (meaning this test can be used) for the duration of the  Covid-19 declaration under Section 564(b)(1) of the Act, 21  U.S.C. section 360bbb-3(b)(1), unless the authorization is  terminated or revoked. Performed at [facility], [place] [address], [place], [place] [address]   Blood culture (routine x 2)     Status: None   Collection Time: 03/02/19  8:47 PM   Specimen: BLOOD  Result Value Ref Range Status   Specimen Description  Final    BLOOD BLOOD LEFT FOREARM Performed at [facility] [address], [place], [place] [address]    Special Requests   Final    BOTTLES DRAWN AEROBIC AND ANAEROBIC Blood Culture adequate volume Performed at [facility] [address], [place], [place] [address]    Culture   Final    NO GROWTH 5 DAYS Performed at [facility], [place] [address], [place], [place] [address]    Report Status 03/07/2019 FINAL  Final  Blood culture (routine x 2)     Status: None   Collection Time: 03/02/19  8:47 PM   Specimen: BLOOD  Result Value Ref Range Status   Specimen Description   Final    BLOOD BLOOD RIGHT FOREARM Performed at [facility] [address] [place], [place] [address]    Special Requests   Final    BOTTLES DRAWN AEROBIC AND ANAEROBIC Blood Culture adequate volume Performed at [facility] [address] [place], [place] [address]    Culture   Final    NO GROWTH 5 DAYS Performed at [facility], [place] [address] [place], [place] [address]    Report Status 03/07/2019 FINAL  Final  Urine culture     Status: Abnormal   Collection Time: 03/02/19 10:32 PM   Specimen: Urine, Random  Result Value Ref Range Status   Specimen Description   Final    URINE, RANDOM Performed at [facility] [address] [place], [place] [address]    Special Requests   Final    NONE Performed at [facility], [place] [address], [place], [place] [address]    Culture MULTIPLE SPECIES PRESENT, SUGGEST RECOLLECTION (A)  Final   Report Status 03/04/2019 FINAL  Final  CSF culture     Status: None   Collection Time: 03/03/19 11:05 AM   Specimen: CSF; Cerebrospinal Fluid  Result Value Ref Range Status   Specimen Description   Final    CSF Performed at [facility] [address], [place], [place] [address]    Special Requests   Final    Normal Performed at [facility], [place] [address], [place], [place] [address]    Gram Stain   Final    NO WBC SEEN NO ORGANISMS SEEN CYTOSPIN Gram Stain Report Called to,Read Back By and Verified With: BLOCK, D. RN 5883 ON 12.21.2020 BY [facility] Performed at [facility], [place] [address], [place], [place] [address]    Culture   Final    NO GROWTH 3 DAYS Performed at  [facility], [place] [address] [place], [place] [address]    Report Status 03/06/2019 FINAL  Final     Labs: Basic Metabolic Panel: Recent Labs  Lab 03/02/19 1750 03/03/19 0530 03/07/19 1410 03/08/19 0826 03/09/19 0332  NA 133* 135 140 141 140  K 3.6 3.7 3.4* 3.5 3.7  CL 99 103 109 109 109  CO2 _0 21*  GLUCOSE 105* 99 97 89 88  BUN 5* 6 <5* <5* <5*  CREATININE 0.70 0.64 0.65 0.48 0.57  CALCIUM 8.7* 8.4* 8.3* 8.4* 8.4*  MG  --   --  1.8 1.9  --    Liver Function Tests: Recent Labs  Lab 03/03/19 0530 03/04/19 0608 03/07/19 1410 03/08/19 0826 03/09/19 0332  AST 225* 197* 262* 176* 131*  ALT 165* 162* 254* 200*  154*  ALKPHOS 185* 186* 313* 296* 287*  BILITOT 3.8* 3.1* 1.9* 2.1* 1.3*  PROT 7.0 6.5 6.2* 6.4* 6.1*  ALBUMIN 3.1* 2.9* 2.8* 2.8* 2.6*   Recent Labs  Lab 03/02/19 1750  LIPASE 30   No results for input(s): AMMONIA in the last 168 hours. CBC: Recent Labs  Lab 03/02/19 1750 03/03/19 0530 03/07/19 1410 03/08/19 0826  WBC 5.8 6.3 9.9 9.8  NEUTROABS 3.4  --  2.5 2.4  HGB 13.0 12.3 12.2 12.3  HCT 40.9 38.7 37.0 38.8  MCV 89.9 91.3 88.5 90.2  PLT 181 147* 239 263   Cardiac Enzymes: No results for input(s): CKTOTAL, CKMB, CKMBINDEX, TROPONINI in the last 168 hours. BNP: BNP (last 3 results) No results for input(s): BNP in the last 8760 hours.  ProBNP (last 3 results) No results for input(s): PROBNP in the last 8760 hours.  CBG: No results for input(s): GLUCAP in the last 168 hours.     Signed:  Florencia Reasons MD, PhD, FACP  [facility] 03/09/2019, 10:28 AM

## 2019-03-10 LAB — PATHOLOGIST SMEAR REVIEW

## 2019-03-11 ENCOUNTER — Other Ambulatory Visit: Payer: Self-pay

## 2019-03-11 ENCOUNTER — Ambulatory Visit (INDEPENDENT_AMBULATORY_CARE_PROVIDER_SITE_OTHER): Payer: Self-pay | Admitting: Family

## 2019-03-11 ENCOUNTER — Encounter: Payer: Self-pay | Admitting: Family

## 2019-03-11 VITALS — Temp 97.1°F | Wt 374.0 lb

## 2019-03-11 DIAGNOSIS — R509 Fever, unspecified: Secondary | ICD-10-CM

## 2019-03-11 DIAGNOSIS — R748 Abnormal levels of other serum enzymes: Secondary | ICD-10-CM

## 2019-03-11 DIAGNOSIS — B2700 Gammaherpesviral mononucleosis without complication: Secondary | ICD-10-CM

## 2019-03-11 DIAGNOSIS — R894 Abnormal immunological findings in specimens from other organs, systems and tissues: Secondary | ICD-10-CM

## 2019-03-11 LAB — EHRLICHIA ANTIBODY PANEL
E chaffeensis (HGE) Ab, IgG: NEGATIVE
E chaffeensis (HGE) Ab, IgM: NEGATIVE
E. Chaffeensis (HME) IgM Titer: NEGATIVE
E.Chaffeensis (HME) IgG: NEGATIVE

## 2019-03-11 NOTE — Assessment & Plan Note (Signed)
Daisy Goodman has transaminitis which is improving since leaving the hospital which she relates possibly due to the amount of Tylenol she was taking.  She is avoided Tylenol since that time.  Recheck LFTs today.

## 2019-03-11 NOTE — Progress Notes (Signed)
Subjective:    Patient ID: Daisy Goodman, female    DOB: 1995-11-24, 23 y.o.   MRN: [id number]  Chief Complaint  Patient presents with  . Follow-up    no complaints; some mild back pain where LP was done; does report feeling better since discharge    HPI:  Daisy Goodman is a 23 y.o. female with previous medical history of asthma who was recently admitted to the hospital with fever, headaches, and nausea/vomiting.  On admission noted to have elevated transaminitis.  On arrival to the ED had a temperature 103.1 and was tachycardic.  CT head and abdomen were without significant findings.  Initially started on ceftriaxone which was discontinued.  Lumbar puncture performed with CSF culture without growth and white blood cell count of 4 and glucose of 59.  Initially started on vancomycin and ceftriaxone with concern for meningitis and was unfounded following lumbar puncture.  ID consulted with recommendation for discontinuation of ceftriaxone and evaluation for CMV, Epstein-Barr virus, and HIV RNA levels.  Hepatitis panel was negative for hepatitis B or hepatitis C.  CMV, Ehrlichia and [facility] spotted fever were negative.  Epstein-Barr virus IgM positive at 115.  Diagnosed at discharge with Epstein-Barr virus infection.  Liver function tests were improving during hospitalization and she was discharged with supportive care and follow-up with ID office.  Daisy Goodman continues to have low back pain in the area of her lumbar puncture without any further fevers, chills, headaches, nausea, or vomiting.  She is feeling better since leaving the hospital.  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  Back pain is described as dull and achy.   No Known Allergies  Outpatient Medications Prior to Visit  Medication Sig Dispense Refill  . Etonogestrel (NEXPLANON [place]) Inject into the skin.     No facility-administered medications prior to visit.      Past Medical History:  Diagnosis Date  . Asthma       Past Surgical History:  Procedure Laterality Date  . IR FL GUIDED LOC OF NEEDLE/CATH TIP FOR SPINAL INJECTION RT  03/08/2019      Family History  Problem Relation Age of Onset  . Hypertension Other   . Heart attack Other       Social History   Socioeconomic History  . Marital status: Single    Spouse name: Not on file  . Number of children: Not on file  . Years of education: Not on file  . Highest education level: Not on file  Occupational History  . Not on file  Tobacco Use  . Smoking status: Never Smoker  . Smokeless tobacco: Never Used  Substance and Sexual Activity  . Alcohol use: No  . Drug use: No  . Sexual activity: Yes    Birth control/protection: Implant, Condom  Other Topics Concern  . Not on file  Social History Narrative  . Not on file   Social Determinants of Health   Financial Resource Strain:   . Difficulty of Paying Living Expenses: Not on file  Food Insecurity:   . Worried About Charity fundraiser in the Last Year: Not on file  . Ran Out of Food in the Last Year: Not on file  Transportation Needs:   . Lack of Transportation (Medical): Not on file  . Lack of Transportation (Non-Medical): Not on file  Physical Activity:   . Days of Exercise per Week: Not on file  .  Minutes of Exercise per Session: Not on file  Stress:   . Feeling of Stress : Not on file  Social Connections:   . Frequency of Communication with Friends and Family: Not on file  . Frequency of Social Gatherings with Friends and Family: Not on file  . Attends Religious Services: Not on file  . Active Member of Clubs or Organizations: Not on file  . Attends Archivist Meetings: Not on file  . Marital Status: Not on file  Intimate Partner Violence:   . Fear of Current or Ex-Partner: Not on file  . Emotionally Abused: Not on file  . Physically Abused: Not on file  . Sexually Abused: Not on file       Review of Systems  Constitutional: Negative for chills, diaphoresis, fatigue and fever.  Respiratory: Negative for cough, chest tightness, shortness of breath and wheezing.   Cardiovascular: Negative for chest pain.  Gastrointestinal: Negative for abdominal pain, diarrhea, nausea and vomiting.       Objective:    Temp (!) 97.1 F (36.2 C) (Other (Comment))   Wt (!) 374 lb (169.6 kg)   LMP 02/26/2019 (Approximate) Comment: neg preg test done  BMI 50.72 kg/m  Nursing note and vital signs reviewed.  Physical Exam Constitutional:      General: She is not in acute distress.    Appearance: She is well-developed. She is obese.     Comments: Seated in a chair; pleasant  Cardiovascular:     Rate and Rhythm: Normal rate and regular rhythm.     Heart sounds: Normal heart sounds.  Pulmonary:     Effort: Pulmonary effort is normal.     Breath sounds: Normal breath sounds.  Skin:    General: Skin is warm and dry.     Comments: Lumbar puncture site appears well-healed with no drainage or induration.  Neurological:     Mental Status: She is alert and oriented to person, place, and time.  Psychiatric:        Behavior: Behavior normal.        Thought Content: Thought content normal.        Judgment: Judgment normal.         Assessment & Plan:   Patient Active Problem List   Diagnosis Date Noted  . High fever 03/03/2019  . Elevated liver enzymes 03/03/2019  . Headache 03/03/2019  . Fever and chills 03/03/2019  . Fever 03/03/2019  . Rhinitis, allergic 02/26/2015  . Dysmenorrhea 02/26/2015  . Severe obesity (BMI >= 40) ([facility]) 09/17/2013  . Asthma, chronic 09/17/2013     Problem List Items Addressed This Visit      Other   Elevated liver enzymes    Daisy Goodman has transaminitis which is improving since leaving the hospital which she relates possibly due to the amount of Tylenol she was taking.  She is avoided Tylenol since that time.  Recheck LFTs today.       Relevant Orders   Comp Met (CMET)   CBC w/Diff   C-reactive protein   Sedimentation rate   Fever - Primary    [name] has remained afebrile since leaving the hospital with most likely source of infection being Epstein-Barr virus.  Recommend continuing supportive care.  CT without evidence of splenomegaly.  Will provide return to work note for 03/15/2019.  Check blood work today to ensure resolution.  Can follow-up with ID clinic as needed pending blood work results.      Relevant Orders  Comp Met (CMET)   CBC w/Diff   C-reactive protein   Sedimentation rate    Other Visit Diagnoses    Positive test for Epstein-Barr virus (EBV)       Relevant Orders   Comp Met (CMET)   CBC w/Diff   C-reactive protein   Sedimentation rate       I am having Daisy Goodman maintain her Etonogestrel (NEXPLANON [place]).   Follow-up: Return if symptoms worsen or fail to improve.    Terri Piedra, MSN, FNP-C Nurse Practitioner [facility] Office phone: [phone] Pager: [employer] number: [phone]

## 2019-03-11 NOTE — Patient Instructions (Signed)
Nice to see you.  We will check your blood work today.  May use ibuprofen as needed for back pain also heat and ice.   Will write return to work for 03/15/19.   Have a great day and stay safe!

## 2019-03-11 NOTE — Assessment & Plan Note (Addendum)
Daisy Goodman has remained afebrile since leaving the hospital with most likely source of infection being Epstein-Barr virus.  Recommend continuing supportive care.  CT without evidence of splenomegaly.  Will provide return to work note for 03/15/2019.  Check blood work today to ensure resolution.  Can follow-up with ID clinic as needed pending blood work results.

## 2019-03-12 LAB — CBC WITH DIFFERENTIAL/PLATELET
Absolute Monocytes: 687 cells/uL (ref 200–950)
Basophils Absolute: 109 cells/uL (ref 0–200)
Basophils Relative: 1 %
Eosinophils Absolute: 76 cells/uL (ref 15–500)
Eosinophils Relative: 0.7 %
HCT: 39.4 % (ref 35.0–45.0)
Hemoglobin: 13 g/dL (ref 11.7–15.5)
Lymphs Abs: 7565 cells/uL — ABNORMAL HIGH (ref 850–3900)
MCH: 28.2 pg (ref 27.0–33.0)
MCHC: 33 g/dL (ref 32.0–36.0)
MCV: 85.5 fL (ref 80.0–100.0)
MPV: 9.1 fL (ref 7.5–12.5)
Monocytes Relative: 6.3 %
Neutro Abs: 2463 cells/uL (ref 1500–7800)
Neutrophils Relative %: 22.6 %
Platelets: 287 10*3/uL (ref 140–400)
RBC: 4.61 10*6/uL (ref 3.80–5.10)
RDW: 14.4 % (ref 11.0–15.0)
Total Lymphocyte: 69.4 %
WBC: 10.9 10*3/uL — ABNORMAL HIGH (ref 3.8–10.8)

## 2019-03-12 LAB — COMPREHENSIVE METABOLIC PANEL
AG Ratio: 1.1 (calc) (ref 1.0–2.5)
ALT: 92 U/L — ABNORMAL HIGH (ref 6–29)
AST: 63 U/L — ABNORMAL HIGH (ref 10–30)
Albumin: 3.7 g/dL (ref 3.6–5.1)
Alkaline phosphatase (APISO): 282 U/L — ABNORMAL HIGH (ref 31–125)
BUN/Creatinine Ratio: 10 (calc) (ref 6–22)
BUN: 5 mg/dL — ABNORMAL LOW (ref 7–25)
CO2: 26 mmol/L (ref 20–32)
Calcium: 9.2 mg/dL (ref 8.6–10.2)
Chloride: 105 mmol/L (ref 98–110)
Creat: 0.5 mg/dL (ref 0.50–1.10)
Globulin: 3.3 g/dL (calc) (ref 1.9–3.7)
Glucose, Bld: 108 mg/dL — ABNORMAL HIGH (ref 65–99)
Potassium: 3.8 mmol/L (ref 3.5–5.3)
Sodium: 141 mmol/L (ref 135–146)
Total Bilirubin: 1.2 mg/dL (ref 0.2–1.2)
Total Protein: 7 g/dL (ref 6.1–8.1)

## 2019-03-12 LAB — SEDIMENTATION RATE: Sed Rate: 75 mm/h — ABNORMAL HIGH (ref 0–20)

## 2019-03-12 LAB — C-REACTIVE PROTEIN: CRP: 20.8 mg/L — ABNORMAL HIGH (ref <8.0)

## 2019-08-29 ENCOUNTER — Telehealth: Payer: Self-pay | Admitting: Physician Assistant

## 2019-08-29 NOTE — Telephone Encounter (Signed)
Pre-screening for onsite visit ° °1. Who is bringing the patient to the visit? Patient ° °Informed only one adult can bring patient to the visit to limit possible exposure to COVID19 and facemasks must be worn while in the building by the patient (ages 2 and older) and adult. ° °2. Has the person bringing the patient or the patient been around anyone with suspected or confirmed COVID-19 in the last 14 days? No ° °3. Has the person bringing the patient or the patient been around anyone who has been tested for COVID-19 in the last 14 days? NO ° °4. Has the person bringing the patient or the patient had any of these symptoms in the last 14 days? NO  ° °Fever (temp 100 F or higher) °Breathing problems °Cough °Sore throat °Body aches °Chills °Vomiting °Diarrhea °Loss of taste or smell ° ° °If all answers are negative, advise patient to call our office prior to your appointment if you or the patient develop any of the symptoms listed above. °  °If any answers are yes, cancel in-office visit and schedule the patient for a same day telehealth visit with a provider to discuss the next steps. °

## 2019-09-01 ENCOUNTER — Ambulatory Visit: Payer: Self-pay | Admitting: Family

## 2019-10-09 ENCOUNTER — Ambulatory Visit: Payer: Medicaid Other | Admitting: Family

## 2019-10-09 NOTE — Progress Notes (Deleted)
This note is not being shared with the patient for the following reason: {Block Note Sharing with Patient:23411}  THIS RECORD MAY CONTAIN CONFIDENTIAL INFORMATION THAT SHOULD NOT BE RELEASED WITHOUT REVIEW OF THE SERVICE PROVIDER.  Adolescent Medicine Consultation Follow-Up Visit Daisy Goodman  is a 24 y.o. female referred by Norm Salt, PA here today for follow-up regarding nexplanon removal.     Plan at last adolescent specialty clinic visit included: Nexplanon placed 04/18/16.  Pertinent Labs? No Growth Chart Viewed? yes   History was provided by the {CHL AMB PERSONS; PED RELATIVES/OTHER W/PATIENT:[phone]}.  Interpreter? no  Chief complaint: nexplanon removal  HPI:   PCP Confirmed?  no  My Chart Activated?   yes  Patient's personal or confidential phone number: ***  History significant ofasthma, dysmenorrhea and obesity. Hospitalized 02/2019 w fever, headache, weakness -- etiology ultimately thought to be EBV.  Nexplanon placed 04/18/16.  Dysmenorrhea.  ****************     No LMP recorded. No Known Allergies Current Outpatient Medications on File Prior to Visit  Medication Sig Dispense Refill  . Etonogestrel (NEXPLANON [place]) Inject into the skin.     No current facility-administered medications on file prior to visit.    Patient Active Problem List   Diagnosis Date Noted  . High fever 03/03/2019  . Elevated liver enzymes 03/03/2019  . Headache 03/03/2019  . Fever and chills 03/03/2019  . Fever 03/03/2019  . Rhinitis, allergic 02/26/2015  . Dysmenorrhea 02/26/2015  . Severe obesity (BMI >= 40) ([facility]) 09/17/2013  . Asthma, chronic 09/17/2013     Activities:  Special interests/hobbies/sports: ***  Lifestyle habits that can impact QOL: Sleep:*** Eating habits/patterns: *** Water intake: *** Body Movement: ***  Confidentiality was discussed with the patient and if applicable, with caregiver as well.  Changes at home or school since last  visit:  {YES/NO/WILD EGBTD:17616}  Tobacco?  {YES/NO/WILD WVPXT:06269} Drugs/ETOH?  {YES/NO/WILD SWNIO:27035} Partner preference?  {CHL AMB PARTNER PREFERENCE:[phone]}  Sexually Active?  {YES/NO/WILD KKXFG:18299}; {Pregnancy [phone]}  Suicidal or homicidal thoughts?   {YES/NO/WILD BZJIR:67893} Self injurious behaviors?  {YES/NO/WILD YBOFB:51025}   {Common ambulatory SmartLinks:19316}  Physical Exam:  There were no vitals filed for this visit. There were no vitals taken for this visit. Body mass index: body mass index is unknown because there is no height or weight on file. Growth percentile SmartLinks can only be used for patients less than 74 years old.  *** Physical Exam  General: well appearing, no distress HEENT: sclera white, mucus membranes moist, no oral lesions, TMs normal CV: RRR, no murmurs, CR 2 sec RESP: no tachypnea, no increased WOB, lungs CTAB ABD: BS+, soft, nontender, nondistended, no masses EXT: no cyanosis, no swelling NEURO: appropriate mentation, no focal deficits ***  Assessment/Plan: ***  BH screenings:  PHQ-SADS Last 3 Score only 03/11/2019  PHQ-9 Total Score 0   *** Screens discussed with patient and parent and adjustments to plan made accordingly.   Follow-up:  No follow-ups on file.   Medical decision-making:  >*** minutes spent face to face with patient with more than 50% of appointment spent discussing diagnosis, management, follow-up, and reviewing of ***.

## 2019-10-13 ENCOUNTER — Ambulatory Visit (INDEPENDENT_AMBULATORY_CARE_PROVIDER_SITE_OTHER): Payer: Medicaid Other

## 2019-10-13 ENCOUNTER — Other Ambulatory Visit: Payer: Self-pay

## 2019-10-13 ENCOUNTER — Ambulatory Visit: Payer: Medicaid Other | Admitting: Pediatrics

## 2019-10-13 ENCOUNTER — Encounter: Payer: Self-pay | Admitting: Pediatrics

## 2019-10-13 VITALS — BP 128/80 | HR 86 | Ht 72.05 in | Wt >= 6400 oz

## 2019-10-13 DIAGNOSIS — N946 Dysmenorrhea, unspecified: Secondary | ICD-10-CM | POA: Diagnosis not present

## 2019-10-13 DIAGNOSIS — Z30011 Encounter for initial prescription of contraceptive pills: Secondary | ICD-10-CM

## 2019-10-13 DIAGNOSIS — Z3046 Encounter for surveillance of implantable subdermal contraceptive: Secondary | ICD-10-CM

## 2019-10-13 DIAGNOSIS — Z7189 Other specified counseling: Secondary | ICD-10-CM

## 2019-10-13 DIAGNOSIS — Z113 Encounter for screening for infections with a predominantly sexual mode of transmission: Secondary | ICD-10-CM

## 2019-10-13 DIAGNOSIS — Z23 Encounter for immunization: Secondary | ICD-10-CM

## 2019-10-13 MED ORDER — NORETHIN ACE-ETH ESTRAD-FE 1.5-30 MG-MCG PO TABS: 1.0000 | ORAL_TABLET | Freq: Every day | ORAL | 3 refills | Status: DC

## 2019-10-13 NOTE — Progress Notes (Signed)

## 2019-10-13 NOTE — Progress Notes (Signed)
History was provided by the patient.  Daisy Goodman is a 24 y.o. female who is here for nexplanon removal.  Norm Salt, PA   HPI:  Pt reports that she is here for nexplanon removal. She liked nexplanon but she feels ike she gained weight fast and has been harder to lose weight.   She has thought about OCP. And thinks that will be best for her. Her sister had IUD and didn't like it, although she is willing to consider in the future if OCP doesn't work. She doesn't have migraine with aura. Has had irregular menses and dysmenorrhea in the past.   Would like covid vaccine today. She was severely ill in the winter and hospitalized with what turned out to be EBV. She is feeling better now, but understands risks of COVID with restarting school.  No LMP recorded.  Review of Systems  Constitutional: Negative for malaise/fatigue.  Eyes: Negative for double vision.  Respiratory: Negative for shortness of breath.   Cardiovascular: Negative for chest pain and palpitations.  Gastrointestinal: Negative for abdominal pain, constipation, diarrhea, nausea and vomiting.  Genitourinary: Negative for dysuria.  Musculoskeletal: Negative for joint pain and myalgias.  Skin: Negative for rash.  Neurological: Negative for dizziness and headaches.  Endo/Heme/Allergies: Does not bruise/bleed easily.    Patient Active Problem List   Diagnosis Date Noted  . High fever 03/03/2019  . Elevated liver enzymes 03/03/2019  . Headache 03/03/2019  . Fever and chills 03/03/2019  . Fever 03/03/2019  . Rhinitis, allergic 02/26/2015  . Dysmenorrhea 02/26/2015  . Severe obesity (BMI >= 40) ([facility]) 09/17/2013  . Asthma, chronic 09/17/2013    No current outpatient medications on file prior to visit.   No current facility-administered medications on file prior to visit.    No Known Allergies   Physical Exam:    Vitals:   10/13/19 0908  BP: 128/80  Pulse: 86  Weight: (!) 402 lb 12.8 oz (182.7 kg)   Height: 6' 0.05" (1.83 m)    Growth percentile SmartLinks can only be used for patients less than 30 years old.  Physical Exam Vitals and nursing note reviewed.  Constitutional:      General: She is not in acute distress.    Appearance: She is well-developed.  Neck:     Thyroid: No thyromegaly.  Cardiovascular:     Rate and Rhythm: Normal rate and regular rhythm.     Heart sounds: No murmur heard.   Pulmonary:     Breath sounds: Normal breath sounds.  Abdominal:     Palpations: Abdomen is soft. There is no mass.     Tenderness: There is no abdominal tenderness. There is no guarding.  Musculoskeletal:     Right lower leg: No edema.     Left lower leg: No edema.  Lymphadenopathy:     Cervical: No cervical adenopathy.  Skin:    General: Skin is warm.     Findings: No rash.  Neurological:     Mental Status: She is alert.     Comments: No tremor     Assessment/Plan: 1. Encounter for Nexplanon removal See procedure note. Tolerated well.   2. Dysmenorrhea Will monitor with OCP.   3. Encounter for BCP (birth control pills) initial prescription No contraindications to estrogen therapy. Discussed increased risk of clotting.  - norethindrone-ethinyl estradiol-iron (JUNEL FE 1.5/30) 1.5-30 MG-MCG tablet; Take 1 tablet by mouth daily.  Dispense: 84 tablet; Refill: 3  4. Counseled about COVID-19 virus infection Vaccine  today. Rescheduled for 2nd dose.   5. Routine screening for STI (sexually transmitted infection) Per protocol.  - Urine cytology ancillary only  Return as needed via video or in person   Alfonso Ramus, FNP

## 2019-10-13 NOTE — Patient Instructions (Signed)
Start taking birth control pills today. They are not effective for 7 days.    Your Nexplanon was removed today and is no longer preventing pregnancy.  If you have sex, remember to use condoms to prevent pregnancy and to prevent sexually transmitted infections.  Leave the outside bandage on for 24 hours.  Leave the smaller bandages on for 3-5 days or until they fall off on their own.  Keep the area clean and dry for 3-5 days.  There is usually bruising or swelling at and around the removal site for a few days to a week after the removal.  If you see redness or pus draining from the removal site, call us immediately.  We would like you to return to the clinic for a follow-up visit in 1 month.  You can call [facility] 24 hours a day with any questions or concerns.  There is always a nurse or doctor available to take your call.  Call 9-1-1 if you have a life-threatening emergency.  For anything else, please call us at [phone] before heading to the ER.

## 2019-11-10 ENCOUNTER — Ambulatory Visit: Payer: Self-pay

## 2019-11-15 ENCOUNTER — Other Ambulatory Visit: Payer: Self-pay

## 2019-11-15 ENCOUNTER — Ambulatory Visit (INDEPENDENT_AMBULATORY_CARE_PROVIDER_SITE_OTHER): Payer: Self-pay

## 2019-11-15 DIAGNOSIS — Z23 Encounter for immunization: Secondary | ICD-10-CM

## 2019-11-15 NOTE — Progress Notes (Signed)
   Covid-19 Vaccination Clinic  Name:  QUINLYN TEP    MRN: [id number] DOB: 10/28/1995  11/15/2019  Ms. Lund was observed post Covid-19 immunization for 15 minutes without incident. She was provided with Vaccine Information Sheet and instruction to access the V-Safe system.   Ms. Castello was instructed to call 911 with any severe reactions post vaccine: Marland Kitchen Difficulty breathing  . Swelling of face and throat  . A fast heartbeat  . A bad rash all over body  . Dizziness and weakness   Immunizations Administered    Name Date Dose VIS Date Route   Pfizer COVID-19 Vaccine 11/15/2019 10:06 AM 0.3 mL 05/07/2018 Intramuscular   Manufacturer: [employer], [employer]   Lot: [id number]   NDC: [address]-2

## 2020-01-13 ENCOUNTER — Ambulatory Visit (HOSPITAL_COMMUNITY)
Admission: EM | Admit: 2020-01-13 | Discharge: 2020-01-13 | Disposition: A | Discharge status: Home / Self Care | Payer: Self-pay | Attending: Urgent Care | Admitting: Urgent Care

## 2020-01-13 ENCOUNTER — Other Ambulatory Visit: Payer: Self-pay

## 2020-01-13 ENCOUNTER — Encounter (HOSPITAL_COMMUNITY): Payer: Self-pay | Admitting: *Deleted

## 2020-01-13 DIAGNOSIS — R21 Rash and other nonspecific skin eruption: Secondary | ICD-10-CM

## 2020-01-13 MED ORDER — HYDROXYZINE HCL 25 MG PO TABS: 12.5000 mg | ORAL_TABLET | Freq: Three times a day (TID) | ORAL | 0 refills | Status: DC | PRN

## 2020-01-13 MED ORDER — PREDNISONE 20 MG PO TABS: ORAL_TABLET | ORAL | 0 refills | Status: DC

## 2020-01-13 NOTE — Discharge Instructions (Signed)
Please make sure you follow up with your regular doctor today to pursue lab tests to make sure that you look into conditions like lupus which can cause rashes of the face.

## 2020-01-13 NOTE — ED Provider Notes (Signed)
[facility]   MRN: [id number] DOB: May 05, 1995  Subjective:   Daisy Goodman is a 24 y.o. female presenting for acute onset of dry scaly skin rash over her face and cheeks today.  Patient states that symptoms started after she used a towel to had some Dial soap on it.  She recently started using this Dial soap in the past 6 months and has reacted to it.  This was at the recommendation of the dermatologist and sees her mother.  Admits history of general skin reactions and rashes.  Denies oral swelling, throat closing sensation, chest tightness, wheezing.  She does have a PCP, set up an appointment with them for later today.  She also uses Dove soap but not on her face.  Denies any other new exposures, new medications, new creams, new detergents.  No current facility-administered medications for this encounter.  Current Outpatient Medications:  .  norethindrone-ethinyl estradiol-iron (JUNEL FE 1.5/30) 1.5-30 MG-MCG tablet, Take 1 tablet by mouth daily., Disp: 84 tablet, Rfl: 3   No Known Allergies  Past Medical History:  Diagnosis Date  . Asthma      Past Surgical History:  Procedure Laterality Date  . IR [place] GUIDED LOC OF NEEDLE/CATH TIP FOR SPINAL INJECTION RT  03/08/2019    Family History  Problem Relation Age of Onset  . Hypertension Other   . Heart attack Other     Social History   Tobacco Use  . Smoking status: Passive Smoke Exposure - Never Smoker  . Smokeless tobacco: Never Used  Substance Use Topics  . Alcohol use: No  . Drug use: No    ROS   Objective:   Vitals: BP 137/81 (BP Location: Left Arm)   Pulse (!) 102   Temp 98.2 F (36.8 C) (Oral)   Resp 18   Ht 6' (1.829 m)   Wt (!) 401 lb 3.8 oz (182 kg)   LMP 12/15/2019 (LMP Unknown)   SpO2 98%   BMI 54.42 kg/m   Physical Exam Constitutional:      General: She is not in acute distress.    Appearance: Normal appearance. She is well-developed. She is not ill-appearing,  toxic-appearing or diaphoretic.  HENT:     Head: Normocephalic and atraumatic.     Comments: Dry scaly rash over her forehead extending into the malar area.  There is slight weeping of the forehead.    Nose: Nose normal.     Mouth/Throat:     Mouth: Mucous membranes are moist.     Pharynx: Oropharynx is clear.  Eyes:     General: No scleral icterus.       Right eye: No discharge.        Left eye: No discharge.     Extraocular Movements: Extraocular movements intact.     Conjunctiva/sclera: Conjunctivae normal.     Pupils: Pupils are equal, round, and reactive to light.  Cardiovascular:     Rate and Rhythm: Normal rate.  Pulmonary:     Effort: Pulmonary effort is normal.  Skin:    General: Skin is warm and dry.     Findings: Rash present.  Neurological:     General: No focal deficit present.     Mental Status: She is alert and oriented to person, place, and time.  Psychiatric:        Mood and Affect: Mood normal.        Behavior: Behavior normal.  Thought Content: Thought content normal.        Judgment: Judgment normal.     Assessment and Plan :   PDMP not reviewed this encounter.  1. Rash and nonspecific skin eruption     Counseled patient on the possibility of an inflammatory skin rash versus skin reaction.  Recommended oral prednisone course, hydroxyzine.  Emphasized need to maintain follow-up with her PCP today for further work-up including lab tests.  Recommended she switch to just the [employer]. Counseled patient on potential for adverse effects with medications prescribed/recommended today, ER and return-to-clinic precautions discussed, patient verbalized understanding.    Wallis Bamberg, PA-C 01/13/20 1302

## 2020-01-13 NOTE — ED Triage Notes (Signed)
Pt reports today her skin turned red across forehead and on side of nose. Pt reports she uses Dial soap on her face . Today her skin is burning and stinging.

## 2020-01-28 ENCOUNTER — Ambulatory Visit: Payer: Medicaid Other | Admitting: Pediatrics

## 2020-03-06 ENCOUNTER — Emergency Department (HOSPITAL_COMMUNITY)
Admission: EM | Admit: 2020-03-06 | Discharge: 2020-03-06 | Disposition: A | Discharge status: Home / Self Care | Payer: Medicaid Other | Attending: Emergency Medicine | Admitting: Emergency Medicine

## 2020-03-06 ENCOUNTER — Other Ambulatory Visit: Payer: Self-pay

## 2020-03-06 ENCOUNTER — Encounter (HOSPITAL_COMMUNITY): Payer: Self-pay | Admitting: Emergency Medicine

## 2020-03-06 DIAGNOSIS — J45909 Unspecified asthma, uncomplicated: Secondary | ICD-10-CM | POA: Insufficient documentation

## 2020-03-06 DIAGNOSIS — J020 Streptococcal pharyngitis: Secondary | ICD-10-CM | POA: Insufficient documentation

## 2020-03-06 DIAGNOSIS — Z7722 Contact with and (suspected) exposure to environmental tobacco smoke (acute) (chronic): Secondary | ICD-10-CM | POA: Insufficient documentation

## 2020-03-06 LAB — GROUP A STREP BY PCR: Group A Strep by PCR: DETECTED — AB

## 2020-03-06 MED ORDER — LIDOCAINE VISCOUS HCL 2 % MT SOLN: 15.0000 mL | OROMUCOSAL | 0 refills | Status: DC | PRN

## 2020-03-06 MED ORDER — IBUPROFEN 800 MG PO TABS: 800.0000 mg | ORAL_TABLET | Freq: Three times a day (TID) | ORAL | 0 refills | Status: DC

## 2020-03-06 MED ORDER — LIDOCAINE VISCOUS HCL 2 % MT SOLN
15.0000 mL | Freq: Once | OROMUCOSAL | Status: AC
  Administered 2020-03-06: 15 mL via OROMUCOSAL
  Filled 2020-03-06: qty 15

## 2020-03-06 MED ORDER — PENICILLIN G BENZATHINE 1200000 UNIT/2ML IM SUSP
1.2000 10*6.[IU] | Freq: Once | INTRAMUSCULAR | Status: AC
  Administered 2020-03-06: 1.2 10*6.[IU] via INTRAMUSCULAR
  Filled 2020-03-06: qty 2

## 2020-03-06 NOTE — ED Triage Notes (Signed)
Patient states 5 hours ago she began having a sore throat. Patient states her throat burns when she inhales or swallows. Patient states she works at [employer]

## 2020-03-06 NOTE — ED Provider Notes (Signed)
[facility]-EMERGENCY DEPT Provider Note   CSN: [id number] Arrival date & time: 03/06/20  0542     History Chief Complaint  Patient presents with  . Sore Throat    Daisy Goodman is a 24 y.o. female.  The history is provided by the patient. No language interpreter was used.  Sore Throat     24 year old female significant history of asthma, rhinitis, presenting to ED for evaluation of sore throat.  Patient report she developing gradual onset of sore throat since earlier today.  Sore throat is described as a burning uncomfortable sensation worse with swallowing, moderate in severity.  No associated fever or chills no runny nose sneezing or coughing no chest pain or shortness of breath.  No specific treatment tried but patient does work at a nursing facility.  No abdominal pain.  Past Medical History:  Diagnosis Date  . Asthma     Patient Active Problem List   Diagnosis Date Noted  . High fever 03/03/2019  . Elevated liver enzymes 03/03/2019  . Headache 03/03/2019  . Fever and chills 03/03/2019  . Fever 03/03/2019  . Rhinitis, allergic 02/26/2015  . Dysmenorrhea 02/26/2015  . Severe obesity (BMI >= 40) (HCC) 09/17/2013  . Asthma, chronic 09/17/2013    Past Surgical History:  Procedure Laterality Date  . IR FL GUIDED LOC OF NEEDLE/CATH TIP FOR SPINAL INJECTION RT  03/08/2019     OB History   No obstetric history on file.     Family History  Problem Relation Age of Onset  . Hypertension Other   . Heart attack Other     Social History   Tobacco Use  . Smoking status: Passive Smoke Exposure - Never Smoker  . Smokeless tobacco: Never Used  Substance Use Topics  . Alcohol use: No  . Drug use: No    Home Medications Prior to Admission medications   Medication Sig Start Date End Date Taking? Authorizing Provider  hydrOXYzine (ATARAX/VISTARIL) 25 MG tablet Take 0.5-1 tablets (12.5-25 mg total) by mouth every 8 (eight) hours as needed for  itching. 01/13/20   Wallis Bamberg, PA-C  norethindrone-ethinyl estradiol-iron (JUNEL FE 1.5/30) 1.5-30 MG-MCG tablet Take 1 tablet by mouth daily. 10/13/19   Verneda Skill, FNP  predniSONE (DELTASONE) 20 MG tablet Take 2 tablets daily with breakfast. 01/13/20   Wallis Bamberg, PA-C    Allergies    Patient has no known allergies.  Review of Systems   Review of Systems  All other systems reviewed and are negative.   Physical Exam Updated Vital Signs BP (!) 135/105 (BP Location: Left Wrist)   Pulse (!) 107   Temp 98.7 F (37.1 C) (Oral)   Resp 16   SpO2 99%   Physical Exam Vitals and nursing note reviewed.  Constitutional:      General: She is not in acute distress.    Appearance: She is well-developed and well-nourished.  HENT:     Head: Atraumatic.     Mouth/Throat:     Mouth: Mucous membranes are moist.     Pharynx: Uvula midline. Pharyngeal swelling, oropharyngeal exudate and posterior oropharyngeal erythema present.     Tonsils: Tonsillar exudate present. No tonsillar abscesses. 2+ on the right. 2+ on the left.  Eyes:     Conjunctiva/sclera: Conjunctivae normal.  Cardiovascular:     Rate and Rhythm: Normal rate and regular rhythm.     Heart sounds: Normal heart sounds.  Pulmonary:     Breath sounds: No wheezing, rhonchi  or rales.  Musculoskeletal:     Cervical back: Neck supple.  Skin:    Findings: No rash.  Neurological:     Mental Status: She is alert.  Psychiatric:        Mood and Affect: Mood and affect and mood normal.     ED Results / Procedures / Treatments   Labs (all labs ordered are listed, but only abnormal results are displayed) Labs Reviewed  GROUP A STREP BY PCR - Abnormal; Notable for the following components:      Result Value   Group A Strep by PCR DETECTED (*)    All other components within normal limits    EKG None  Radiology No results found.  Procedures Procedures (including critical care time)  Medications Ordered in  ED Medications  lidocaine (XYLOCAINE) 2 % viscous mouth solution 15 mL (has no administration in time range)  penicillin g benzathine (BICILLIN LA) 1200000 UNIT/2ML injection 1.2 Million Units (has no administration in time range)    ED Course  I have reviewed the triage vital signs and the nursing notes.  Pertinent labs & imaging results that were available during my care of the patient were reviewed by me and considered in my medical decision making (see chart for details).    MDM Rules/Calculators/A&P                          BP (!) 135/105 (BP Location: Left Wrist)   Pulse (!) 107   Temp 98.7 F (37.1 C) (Oral)   Resp 16   SpO2 99%   Final Clinical Impression(s) / ED Diagnoses Final diagnoses:  Strep throat    Rx / DC Orders ED Discharge Orders         Ordered    ibuprofen (ADVIL) 800 MG tablet  3 times daily        03/06/20 0711    lidocaine (XYLOCAINE) 2 % solution  As needed        03/06/20 0711         6:55 AM Patient presenting with sore throat.  Throat exam remarkable for posterior oropharyngeal erythema with tonsillar enlargement and exudates but no obvious evidence of peritonsillar abscess and no airway compromise.  Her strep test came back positive.  Will treat with Bicillin IM antibiotic.  Viscous lidocaine given for symptom control.  Patient has been vaccinated for COVID-19.   Fayrene Helper, PA-C 03/06/20 0175    Devoria Albe, MD 03/06/20 [phone]

## 2020-03-06 NOTE — Discharge Instructions (Signed)
You have been diagnosed with strep throat.  You did receive antibiotic in the ED.  Take ibuprofen as needed for pain.  Rinse and spit viscous lidocaine as needed for comfort.  Return if you have any concerns.

## 2020-04-01 ENCOUNTER — Other Ambulatory Visit: Payer: Self-pay

## 2020-04-01 ENCOUNTER — Emergency Department (HOSPITAL_COMMUNITY): Payer: Self-pay

## 2020-04-01 ENCOUNTER — Encounter (HOSPITAL_COMMUNITY): Payer: Self-pay

## 2020-04-01 ENCOUNTER — Emergency Department (HOSPITAL_COMMUNITY)
Admission: EM | Admit: 2020-04-01 | Discharge: 2020-04-01 | Disposition: A | Discharge status: Home / Self Care | Payer: Self-pay | Attending: Emergency Medicine | Admitting: Emergency Medicine

## 2020-04-01 DIAGNOSIS — J45909 Unspecified asthma, uncomplicated: Secondary | ICD-10-CM | POA: Insufficient documentation

## 2020-04-01 DIAGNOSIS — J069 Acute upper respiratory infection, unspecified: Secondary | ICD-10-CM | POA: Insufficient documentation

## 2020-04-01 DIAGNOSIS — Z20822 Contact with and (suspected) exposure to covid-19: Secondary | ICD-10-CM | POA: Insufficient documentation

## 2020-04-01 DIAGNOSIS — Z7722 Contact with and (suspected) exposure to environmental tobacco smoke (acute) (chronic): Secondary | ICD-10-CM | POA: Insufficient documentation

## 2020-04-01 DIAGNOSIS — R1110 Vomiting, unspecified: Secondary | ICD-10-CM | POA: Insufficient documentation

## 2020-04-01 LAB — SARS CORONAVIRUS 2 (TAT 6-24 HRS): SARS Coronavirus 2: NEGATIVE

## 2020-04-01 NOTE — ED Triage Notes (Signed)
Pt arrived via walk in, states she has sore throat, chills, nasal congestions, vomited x1 yesterday. States she had strep throat recently, roommate had as well and thought this was all from that, but still unresolved after abx. Denies any other known sick contacts.

## 2020-04-01 NOTE — ED Provider Notes (Signed)
[facility]-EMERGENCY DEPT Provider Note   CSN: [id number] Arrival date & time: 04/01/20  1884     History Chief Complaint  Patient presents with  . Sore Throat  . Chills    Daisy Goodman is a 25 y.o. female.  Patient is a 25 year old female with a history of asthma who presents with cough and congestion.  She was treated for strep throat on December 25.  She says that her symptoms resolved.  She says that over the last 2 days she has had some worsening coughing which she attributed to her asthma but it has not improved with inhalers.  She has had some posttussive emesis as well.  She has some nasal congestion and chills.  No myalgias.  No known fevers.  She has a mild sore throat.  No neck or back pain.  Intermittent headaches.  Her roommate has similar symptoms.  She is unclear if her roommate was tested for COVID.        Past Medical History:  Diagnosis Date  . Asthma     Patient Active Problem List   Diagnosis Date Noted  . High fever 03/03/2019  . Elevated liver enzymes 03/03/2019  . Headache 03/03/2019  . Fever and chills 03/03/2019  . Fever 03/03/2019  . Rhinitis, allergic 02/26/2015  . Dysmenorrhea 02/26/2015  . Severe obesity (BMI >= 40) (HCC) 09/17/2013  . Asthma, chronic 09/17/2013    Past Surgical History:  Procedure Laterality Date  . IR FL GUIDED LOC OF NEEDLE/CATH TIP FOR SPINAL INJECTION RT  03/08/2019     OB History   No obstetric history on file.     Family History  Problem Relation Age of Onset  . Hypertension Other   . Heart attack Other     Social History   Tobacco Use  . Smoking status: Passive Smoke Exposure - Never Smoker  . Smokeless tobacco: Never Used  Substance Use Topics  . Alcohol use: No  . Drug use: No    Home Medications Prior to Admission medications   Medication Sig Start Date End Date Taking? Authorizing Provider  hydrOXYzine (ATARAX/VISTARIL) 25 MG tablet Take 0.5-1 tablets (12.5-25 mg  total) by mouth every 8 (eight) hours as needed for itching. 01/13/20   Wallis Bamberg, PA-C  ibuprofen (ADVIL) 800 MG tablet Take 1 tablet (800 mg total) by mouth 3 (three) times daily. 03/06/20   Fayrene Helper, PA-C  lidocaine (XYLOCAINE) 2 % solution Use as directed 15 mLs in the mouth or throat as needed for mouth pain. 03/06/20   Fayrene Helper, PA-C  norethindrone-ethinyl estradiol-iron (JUNEL FE 1.5/30) 1.5-30 MG-MCG tablet Take 1 tablet by mouth daily. 10/13/19   Verneda Skill, FNP  predniSONE (DELTASONE) 20 MG tablet Take 2 tablets daily with breakfast. 01/13/20   Wallis Bamberg, PA-C    Allergies    Patient has no known allergies.  Review of Systems   Review of Systems  Constitutional: Positive for chills and fatigue. Negative for diaphoresis and fever.  HENT: Positive for congestion, rhinorrhea and sore throat. Negative for sneezing.   Eyes: Negative.   Respiratory: Positive for cough, shortness of breath and wheezing. Negative for chest tightness.   Cardiovascular: Negative for chest pain and leg swelling.  Gastrointestinal: Positive for vomiting (Posttussive). Negative for abdominal pain, blood in stool, diarrhea and nausea.  Genitourinary: Negative for difficulty urinating, flank pain, frequency and hematuria.  Musculoskeletal: Negative for arthralgias and back pain.  Skin: Negative for rash.  Neurological: Positive  for headaches. Negative for dizziness, speech difficulty, weakness and numbness.    Physical Exam Updated Vital Signs BP 124/86   Pulse (!) 107   Temp 98.3 F (36.8 C) (Oral)   Resp 18   LMP 03/01/2020   SpO2 100%   Physical Exam Constitutional:      Appearance: She is well-developed and well-nourished.  HENT:     Head: Normocephalic and atraumatic.     Ears:     Comments: Clear fluid behind both TMs, TM appears normal bilaterally    Mouth/Throat:     Mouth: Mucous membranes are moist.     Comments: Mild erythema of the posterior pharynx, no exudates, uvula  is midline, no trismus Eyes:     Pupils: Pupils are equal, round, and reactive to light.  Neck:     Comments: No meningismus Cardiovascular:     Rate and Rhythm: Normal rate and regular rhythm.     Heart sounds: Normal heart sounds.  Pulmonary:     Effort: Pulmonary effort is normal. No respiratory distress.     Breath sounds: Normal breath sounds. No wheezing or rales.  Chest:     Chest wall: No tenderness.  Abdominal:     General: Bowel sounds are normal.     Palpations: Abdomen is soft.     Tenderness: There is no abdominal tenderness. There is no guarding or rebound.  Musculoskeletal:        General: No edema. Normal range of motion.     Cervical back: Normal range of motion and neck supple.  Lymphadenopathy:     Cervical: No cervical adenopathy.  Skin:    General: Skin is warm and dry.     Findings: No rash.  Neurological:     Mental Status: She is alert and oriented to person, place, and time.  Psychiatric:        Mood and Affect: Mood and affect normal.     ED Results / Procedures / Treatments   Labs (all labs ordered are listed, but only abnormal results are displayed) Labs Reviewed  SARS CORONAVIRUS 2 (TAT 6-24 HRS)    EKG None  Radiology DG Chest Port 1 View  Result Date: 04/01/2020 CLINICAL DATA:  Cough.  Congestion.  Sore throat. EXAM: PORTABLE CHEST 1 VIEW COMPARISON:  03/02/2019 FINDINGS: Midline trachea. Normal heart size for level of inspiration. No pleural effusion or pneumothorax. Breast tissue projects over the lower lungs. Otherwise, clear lungs. IMPRESSION: No acute cardiopulmonary disease. Electronically Signed   By: Jeronimo Greaves M.D.   On: 04/01/2020 11:40    Procedures Procedures (including critical care time)  Medications Ordered in ED Medications - No data to display  ED Course  I have reviewed the triage vital signs and the nursing notes.  Pertinent labs & imaging results that were available during my care of the patient were  reviewed by me and considered in my medical decision making (see chart for details).    MDM Rules/Calculators/A&P                          Patient is a 25 year old female who presents with runny nose congestion sore throat and coughing.  Her throat exam is benign.  Her lungs are clear.  Chest x-ray shows no acute abnormalities.  This was reviewed by me.  No evidence of pneumonia.  Her COVID test is pending.  She was discharged home in good condition.  Symptomatic care instructions were given.  COVID precautions and return precautions were given. Final Clinical Impression(s) / ED Diagnoses Final diagnoses:  Viral upper respiratory tract infection  Person under investigation for COVID-19    Rx / DC Orders ED Discharge Orders    None       Rolan Bucco, MD 04/01/20 1227

## 2020-04-01 NOTE — Discharge Instructions (Addendum)
You can use over-the-counter medications for symptom control.  You need to stay quarantined until your COVID test comes back.  If your COVID test is positive, you need to be quarantined for at least 5 days and need to have improving symptoms prior to leaving quarantine.  Return here as needed if you have any worsening symptoms.

## 2020-04-25 ENCOUNTER — Other Ambulatory Visit: Payer: Self-pay

## 2020-04-25 ENCOUNTER — Emergency Department (HOSPITAL_COMMUNITY)
Admission: EM | Admit: 2020-04-25 | Discharge: 2020-04-26 | Disposition: A | Discharge status: Home / Self Care | Payer: Medicaid Other | Attending: Emergency Medicine | Admitting: Emergency Medicine

## 2020-04-25 DIAGNOSIS — R0981 Nasal congestion: Secondary | ICD-10-CM | POA: Insufficient documentation

## 2020-04-25 DIAGNOSIS — J020 Streptococcal pharyngitis: Secondary | ICD-10-CM | POA: Insufficient documentation

## 2020-04-25 DIAGNOSIS — Z20822 Contact with and (suspected) exposure to covid-19: Secondary | ICD-10-CM | POA: Insufficient documentation

## 2020-04-25 DIAGNOSIS — J45909 Unspecified asthma, uncomplicated: Secondary | ICD-10-CM | POA: Insufficient documentation

## 2020-04-25 DIAGNOSIS — Z7722 Contact with and (suspected) exposure to environmental tobacco smoke (acute) (chronic): Secondary | ICD-10-CM | POA: Insufficient documentation

## 2020-04-25 MED ORDER — KETOROLAC TROMETHAMINE 30 MG/ML IJ SOLN
30.0000 mg | Freq: Once | INTRAMUSCULAR | Status: AC
  Administered 2020-04-25: 30 mg via INTRAVENOUS
  Filled 2020-04-25: qty 1

## 2020-04-25 MED ORDER — SODIUM CHLORIDE 0.9 % IV BOLUS
1000.0000 mL | Freq: Once | INTRAVENOUS | Status: AC
Start: 2020-04-25 — End: 2020-04-26
  Administered 2020-04-25: 1000 mL via INTRAVENOUS

## 2020-04-25 MED ORDER — LIDOCAINE VISCOUS HCL 2 % MT SOLN
15.0000 mL | Freq: Once | OROMUCOSAL | Status: AC
  Administered 2020-04-25: 15 mL via OROMUCOSAL
  Filled 2020-04-25: qty 15

## 2020-04-25 MED ORDER — ONDANSETRON HCL 4 MG/2ML IJ SOLN
4.0000 mg | Freq: Once | INTRAMUSCULAR | Status: AC
  Administered 2020-04-25: 4 mg via INTRAVENOUS
  Filled 2020-04-25: qty 2

## 2020-04-25 MED ORDER — DEXAMETHASONE SODIUM PHOSPHATE 10 MG/ML IJ SOLN
10.0000 mg | Freq: Once | INTRAMUSCULAR | Status: AC
  Administered 2020-04-25: 10 mg via INTRAVENOUS
  Filled 2020-04-25: qty 1

## 2020-04-25 NOTE — ED Provider Notes (Signed)
[facility]-EMERGENCY DEPT Provider Note   CSN: [id number] Arrival date & time: 04/25/20  2204     History Chief Complaint  Patient presents with  . Sore Throat  . Dysphagia    Daisy Goodman is a 25 y.o. female with a h/o of EBV, streptococcal pharyngitis, asthma, obesity who presents to the emergency department with a chief complaint of sore throat.  The patient reports sudden onset, constant, worsening sore throat that was present when she awoke this morning with associated dysphagia, nausea, chills, and nasal congestion.  She states that she also feels as if her voice has changed, but her roommate has not noticed a change in her voice.   She denies a fever, rash, shortness of breath, cough, chest pain, abdominal pain, vomiting, diarrhea, otalgia, neck pain or stiffness, headache, sinus pain or pressure, or postnasal drip.  She attempted to treat her to symptoms by taking Mucinex and TheraFlu at 10 AM with no improvement in her symptoms.   The patient was seen in the ER on 12/25 and was treated for streptococcal pharyngitis after a positive PCR test.  She was seen again in the emergency department on 01/20 with URI symptoms and tested negative for COVID-19.  She reports that all of her symptoms resolved following her ER visit in January prior to onset of symptoms today.  She has no concerns for pregnancy or STIs.  No known sick contacts.  Reports that she threw away her toothbrush and cleaned her entire house after she tested positive for strep.  Per chart review, the patient had a 1 week admission in December 2020 for fever of unknown origin where she ultimately tested positive for EBV.  At that time, her predominant symptoms were headache, fever, and chills and states that her current presentation is very different.  [name] was evaluated in Emergency Department on 04/26/2020 for the symptoms described in the history of present illness. She was evaluated in  the context of the global COVID-19 pandemic, which necessitated consideration that the patient might be at risk for infection with the SARS-CoV-2 virus that causes COVID-19. Institutional protocols and algorithms that pertain to the evaluation of patients at risk for COVID-19 are in a state of rapid change based on information released by regulatory bodies including the CDC and federal and state organizations. These policies and algorithms were followed during the patient's care in the ED.    The history is provided by the patient and medical records. No language interpreter was used.       Past Medical History:  Diagnosis Date  . Asthma     Patient Active Problem List   Diagnosis Date Noted  . High fever 03/03/2019  . Elevated liver enzymes 03/03/2019  . Headache 03/03/2019  . Fever and chills 03/03/2019  . Fever 03/03/2019  . Rhinitis, allergic 02/26/2015  . Dysmenorrhea 02/26/2015  . Severe obesity (BMI >= 40) (HCC) 09/17/2013  . Asthma, chronic 09/17/2013    Past Surgical History:  Procedure Laterality Date  . IR FL GUIDED LOC OF NEEDLE/CATH TIP FOR SPINAL INJECTION RT  03/08/2019     OB History   No obstetric history on file.     Family History  Problem Relation Age of Onset  . Hypertension Other   . Heart attack Other     Social History   Tobacco Use  . Smoking status: Passive Smoke Exposure - Never Smoker  . Smokeless tobacco: Never Used  Substance Use Topics  .  Alcohol use: No  . Drug use: No    Home Medications Prior to Admission medications   Medication Sig Start Date End Date Taking? Authorizing Provider  lidocaine (XYLOCAINE) 2 % solution Use as directed 15 mLs in the mouth or throat as needed for mouth pain. 04/26/20   Rikita Grabert A, PA-C  norethindrone-ethinyl estradiol-iron (JUNEL FE 1.5/30) 1.5-30 MG-MCG tablet Take 1 tablet by mouth daily. Patient not taking: No sig reported 10/13/19 04/26/20  Verneda Skill, FNP    Allergies    Patient  has no known allergies.  Review of Systems   Review of Systems  Constitutional: Positive for chills. Negative for activity change, fatigue and fever.  HENT: Positive for congestion, sore throat, trouble swallowing and voice change. Negative for drooling, ear discharge, ear pain, mouth sores, postnasal drip, rhinorrhea, sinus pressure and tinnitus.   Eyes: Negative for visual disturbance.  Respiratory: Negative for shortness of breath.   Cardiovascular: Negative for chest pain.  Gastrointestinal: Positive for nausea. Negative for abdominal pain, constipation, diarrhea and vomiting.  Genitourinary: Negative for dysuria.  Musculoskeletal: Negative for back pain, myalgias and neck pain.  Skin: Negative for color change, rash and wound.  Allergic/Immunologic: Negative for immunocompromised state.  Neurological: Negative for dizziness, seizures, syncope, speech difficulty, weakness, light-headedness, numbness and headaches.  Psychiatric/Behavioral: Negative for confusion.    Physical Exam Updated Vital Signs BP (!) 160/88 (BP Location: Right Arm)   Pulse (!) 104   Temp 99.4 F (37.4 C) (Oral)   Resp 20   Ht 6' (1.829 m)   Wt 136.1 kg   SpO2 98%   BMI 40.69 kg/m   Physical Exam Vitals and nursing note reviewed.  Constitutional:      General: She is not in acute distress.    Appearance: She is not ill-appearing, toxic-appearing or diaphoretic.  HENT:     Head: Normocephalic.     Right Ear: Tympanic membrane and ear canal normal. No tenderness. No middle ear effusion. Tympanic membrane is not erythematous.     Left Ear: Tympanic membrane and ear canal normal. No tenderness.  No middle ear effusion. Tympanic membrane is not erythematous.     Nose: Congestion present. No rhinorrhea.     Mouth/Throat:     Mouth: Mucous membranes are moist. No oral lesions.     Pharynx: Uvula midline. Pharyngeal swelling and posterior oropharyngeal erythema present. No oropharyngeal exudate or uvula  swelling.     Tonsils: No tonsillar exudate or tonsillar abscesses.     Comments: 3+ Tonsils bilaterally Eyes:     General: No scleral icterus.       Right eye: No discharge.        Left eye: No discharge.     Conjunctiva/sclera: Conjunctivae normal.  Neck:     Thyroid: No thyromegaly.  Cardiovascular:     Rate and Rhythm: Regular rhythm. Tachycardia present.     Heart sounds: No murmur heard. No friction rub. No gallop.   Pulmonary:     Effort: Pulmonary effort is normal. No respiratory distress.     Breath sounds: No stridor. No wheezing, rhonchi or rales.     Comments: Patient is able to speak in complete, fluent sentences without increased work of breathing.  Lungs are clear to auscultation bilaterally. Chest:     Chest wall: No tenderness.  Abdominal:     General: There is no distension.     Palpations: Abdomen is soft. There is no mass.     Tenderness:  There is no abdominal tenderness. There is no right CVA tenderness, left CVA tenderness, guarding or rebound.     Hernia: No hernia is present.     Comments: Abdomen soft, nontender, nondistended.  Musculoskeletal:     Cervical back: Normal range of motion and neck supple.  Lymphadenopathy:     Cervical: No cervical adenopathy.  Skin:    General: Skin is warm.     Capillary Refill: Capillary refill takes less than 2 seconds.     Coloration: Skin is not jaundiced.     Findings: No rash.     Comments: No rash  Neurological:     Mental Status: She is alert.  Psychiatric:        Mood and Affect: Affect is tearful.        Behavior: Behavior normal.     ED Results / Procedures / Treatments   Labs (all labs ordered are listed, but only abnormal results are displayed) Labs Reviewed  GROUP A STREP BY PCR - Abnormal; Notable for the following components:      Result Value   Group A Strep by PCR DETECTED (*)    All other components within normal limits  SARS CORONAVIRUS 2 (TAT 6-24 HRS)    EKG None  Radiology No  results found.  Procedures Procedures   Medications Ordered in ED Medications  magic mouthwash (has no administration in time range)  lidocaine (XYLOCAINE) 2 % viscous mouth solution 15 mL (15 mLs Mouth/Throat Given 04/25/20 2313)  sodium chloride 0.9 % bolus 1,000 mL (0 mLs Intravenous Stopped 04/26/20 0015)  dexamethasone (DECADRON) injection 10 mg (10 mg Intravenous Given 04/25/20 2312)  ketorolac (TORADOL) 30 MG/ML injection 30 mg (30 mg Intravenous Given 04/25/20 2315)  ondansetron (ZOFRAN) injection 4 mg (4 mg Intravenous Given 04/25/20 2313)  penicillin g benzathine (BICILLIN LA) 1200000 UNIT/2ML injection 1.2 Million Units (1.2 Million Units Intramuscular Given 04/26/20 0115)    ED Course  I have reviewed the triage vital signs and the nursing notes.  Pertinent labs & imaging results that were available during my care of the patient were reviewed by me and considered in my medical decision making (see chart for details).    MDM Rules/Calculators/A&P                          25 year old female with a h/o of EBV, streptococcal pharyngitis, asthma, obesity who presents to the emergency department with a 1 day history of sore throat, dysphagia, chills, nausea.  No fever.   On arrival, patient is tachycardic and blood pressure is elevated, but stable.  Afebrile.  No hypoxia or tachypnea.  Differential diagnosis includes streptococcal pharyngitis, reactivation of EBV, retropharyngeal abscess, peritonsillar abscess, Ludwig's angina, gonococcal pharyngitis, COVID-19, or other viral URI.  On evaluation, patient has 3+ tonsils bilaterally.  She is tolerating secretions without difficulty.  No sublingual edema.   Labs have been reviewed and independently interpreted by me.  Strep PCR test is positive.  COVID-19 test is pending.  Patient was seen and independently evaluated by Dr. Nicanor AlconPalumbo, attending physician.   Patient was given IV fluids, Toradol, Decadron, Zofran, and on reevaluation she  was successfully fluid challenge.  She remained hemodynamically stable.  She is tolerating secretions without difficulty.  We will provide the patient with a referral to ENT.  ER return precautions given.  Safer discharge to home with outpatient follow-up as indicated.  Final Clinical Impression(s) / ED Diagnoses Final diagnoses:  Acute streptococcal pharyngitis    Rx / DC Orders ED Discharge Orders         Ordered    lidocaine (XYLOCAINE) 2 % solution  As needed        04/26/20 0054           Barkley Boards, PA-C 04/26/20 0123    Palumbo, April, MD 04/26/20 [id number]

## 2020-04-25 NOTE — ED Triage Notes (Signed)
Pt has been having difficulty swallowing and breathing since this morning. Pt visited [facility] 4 weeks ago for strep throat. Pt reports pain and swelling in the throat. Pt reports copious mucous production and stuffiness.

## 2020-04-26 LAB — SARS CORONAVIRUS 2 (TAT 6-24 HRS): SARS Coronavirus 2: NEGATIVE

## 2020-04-26 LAB — GROUP A STREP BY PCR: Group A Strep by PCR: DETECTED — AB

## 2020-04-26 MED ORDER — PENICILLIN G BENZATHINE 1200000 UNIT/2ML IM SUSP
1.2000 10*6.[IU] | Freq: Once | INTRAMUSCULAR | Status: AC
  Administered 2020-04-26: 1.2 10*6.[IU] via INTRAMUSCULAR
  Filled 2020-04-26: qty 2

## 2020-04-26 MED ORDER — MAGIC MOUTHWASH
5.0000 mL | Freq: Once | ORAL | Status: DC
  Filled 2020-04-26: qty 5

## 2020-04-26 MED ORDER — LIDOCAINE VISCOUS HCL 2 % MT SOLN: 15.0000 mL | OROMUCOSAL | 0 refills | Status: AC | PRN

## 2020-04-26 NOTE — ED Notes (Signed)
Pt tolerating oral fluids with little difficulty and pain

## 2020-04-26 NOTE — Discharge Instructions (Signed)
Thank you for allowing me to care for you today in the Emergency Department.   You were seen today for a sore throat.  You tested positive for streptococcal pharyngitis.  This was treated with an injection of penicillin in the emergency department.  You were also tested for COVID-19, but the result is pending.  You should receive a call from the hospital if it is positive.  You will need to quarantine at home for a total of 5 days after your symptoms began if this test is positive.  You can also review the results in MyChart.  Instructions on creating a MyChart account are attached along with your discharge paperwork.  Take 650 mg of Tylenol or 600 mg of ibuprofen with food every 6 hours for pain.  You can alternate between these 2 medications every 3 hours if your pain returns.  For instance, you can take Tylenol at noon, followed by a dose of ibuprofen at 3, followed by second dose of Tylenol and 6.  You can also take the liquid version of this medication, which is available over-the-counter.  Use caution if you are taking other over-the-counter medication such as Mucinex or TheraFlu as these medications may contain Tylenol.  You should not take more than 4000 mg of Tylenol from all sources in a 24-hour period.  You can swallow 15 mL of viscous lidocaine once every 3 hours as needed for sore throat.  Eating and drinking hot or cold beverages may be easier to swallow.  You can also gargle warm salt water once every 6 hours.  Please call to schedule follow-up appointment with ear nose and throat since you have had strep throat multiple times over the last year.  Return the emergency department if you become unable to swallow your own saliva, if you develop respiratory distress, significant swelling to your face or neck, if you develop a significant amount of swelling to the floor of mouth, or other new, concerning symptoms.

## 2020-06-19 ENCOUNTER — Other Ambulatory Visit: Payer: Self-pay

## 2020-06-19 ENCOUNTER — Emergency Department (HOSPITAL_COMMUNITY)
Admission: EM | Admit: 2020-06-19 | Discharge: 2020-06-19 | Discharge status: Against Medical Advice | Payer: Self-pay | Attending: Emergency Medicine | Admitting: Emergency Medicine

## 2020-06-19 ENCOUNTER — Encounter (HOSPITAL_COMMUNITY): Payer: Self-pay

## 2020-06-19 ENCOUNTER — Ambulatory Visit
Admission: RE | Admit: 2020-06-19 | Discharge: 2020-06-19 | Disposition: A | Discharge status: Home / Self Care | Payer: Medicaid Other | Source: Ambulatory Visit | Attending: Physician Assistant | Admitting: Physician Assistant

## 2020-06-19 DIAGNOSIS — R110 Nausea: Secondary | ICD-10-CM | POA: Insufficient documentation

## 2020-06-19 DIAGNOSIS — J45909 Unspecified asthma, uncomplicated: Secondary | ICD-10-CM | POA: Insufficient documentation

## 2020-06-19 DIAGNOSIS — R1032 Left lower quadrant pain: Secondary | ICD-10-CM | POA: Insufficient documentation

## 2020-06-19 DIAGNOSIS — Z7722 Contact with and (suspected) exposure to environmental tobacco smoke (acute) (chronic): Secondary | ICD-10-CM | POA: Insufficient documentation

## 2020-06-19 DIAGNOSIS — R109 Unspecified abdominal pain: Secondary | ICD-10-CM

## 2020-06-19 LAB — URINALYSIS, ROUTINE W REFLEX MICROSCOPIC
Bilirubin Urine: NEGATIVE
Glucose, UA: NEGATIVE mg/dL
Ketones, ur: NEGATIVE mg/dL
Nitrite: NEGATIVE
Protein, ur: 30 mg/dL — AB
Specific Gravity, Urine: 1.026 (ref 1.005–1.030)
pH: 5 (ref 5.0–8.0)

## 2020-06-19 LAB — COMPREHENSIVE METABOLIC PANEL
ALT: 10 U/L (ref 0–44)
AST: 14 U/L — ABNORMAL LOW (ref 15–41)
Albumin: 3.9 g/dL (ref 3.5–5.0)
Alkaline Phosphatase: 62 U/L (ref 38–126)
Anion gap: 10 (ref 5–15)
BUN: 9 mg/dL (ref 6–20)
CO2: 22 mmol/L (ref 22–32)
Calcium: 9.1 mg/dL (ref 8.9–10.3)
Chloride: 106 mmol/L (ref 98–111)
Creatinine, Ser: 0.55 mg/dL (ref 0.44–1.00)
GFR, Estimated: >60 mL/min (ref >60)
Glucose, Bld: 93 mg/dL (ref 70–99)
Potassium: 3.5 mmol/L (ref 3.5–5.1)
Sodium: 138 mmol/L (ref 135–145)
Total Bilirubin: 1 mg/dL (ref 0.3–1.2)
Total Protein: 7.6 g/dL (ref 6.5–8.1)

## 2020-06-19 LAB — CBC WITH DIFFERENTIAL/PLATELET
Abs Immature Granulocytes: 0.04 10*3/uL (ref 0.00–0.07)
Basophils Absolute: 0.1 10*3/uL (ref 0.0–0.1)
Basophils Relative: 1 %
Eosinophils Absolute: 0.1 10*3/uL (ref 0.0–0.5)
Eosinophils Relative: 1 %
HCT: 40.9 % (ref 36.0–46.0)
Hemoglobin: 13 g/dL (ref 12.0–15.0)
Immature Granulocytes: 0 %
Lymphocytes Relative: 26 %
Lymphs Abs: 2.5 10*3/uL (ref 0.7–4.0)
MCH: 28.4 pg (ref 26.0–34.0)
MCHC: 31.8 g/dL (ref 30.0–36.0)
MCV: 89.3 fL (ref 80.0–100.0)
Monocytes Absolute: 0.7 10*3/uL (ref 0.1–1.0)
Monocytes Relative: 7 %
Neutro Abs: 6.1 10*3/uL (ref 1.7–7.7)
Neutrophils Relative %: 65 %
Platelets: 387 10*3/uL (ref 150–400)
RBC: 4.58 MIL/uL (ref 3.87–5.11)
RDW: 13.6 % (ref 11.5–15.5)
WBC: 9.4 10*3/uL (ref 4.0–10.5)
nRBC: 0 % (ref 0.0–0.2)

## 2020-06-19 LAB — I-STAT BETA HCG BLOOD, ED (MC, WL, AP ONLY): I-stat hCG, quantitative: <5 m[IU]/mL (ref <5)

## 2020-06-19 NOTE — ED Triage Notes (Signed)
Emergency Medicine Provider Triage Evaluation Note  Daisy Goodman , a 25 y.o. female  was evaluated in triage.  Pt complains of intermittent pelvic cramping as well as nausea for the past 8 days.  She states she had a 2-day menstrual cycle earlier this week which stopped about 2 days ago.  She typically has full 5 to 7-day menstrual cycles on a monthly basis.  This is abnormal for her.  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  She has nausea but no vomiting.  Also reports intermittent diarrhea without hematochezia.  She is sexually active with a female partner and does not use protection.  Review of Systems  Positive: Nausea, pelvic cramping, diarrhea Negative: Chest pain, shortness of breath, vomiting, vaginal discharge, dysuria  Physical Exam  BP (!) 149/98 (BP Location: Left Arm)   Pulse 92   Temp 98.5 F (36.9 C) (Oral)   Resp 18   LMP 06/16/2020 (Approximate)   SpO2 100%  Gen:   Awake, no distress   HEENT:  Atraumatic  Resp:  Normal effort  Cardiac:  Normal rate  Abd:   Nondistended MSK:   Moves extremities without difficulty  Neuro:  Speech clear   Medical Decision Making  Medically screening exam initiated at 9:49 AM.  Appropriate orders placed.  Daisy Goodman was informed that the remainder of the evaluation will be completed by another provider, this initial triage assessment does not replace that evaluation, and the importance of remaining in the ED until their evaluation is complete.  Clinical Impression  Basic labs ordered.   Placido Sou, PA-C 06/19/20 [phone]

## 2020-06-19 NOTE — ED Triage Notes (Signed)
Pt presents with c/o abdominal pain and nausea for approx 2 weeks. Pt reports she did take a pregnancy test at home and did get a very faint positive line. Pt also reports her period came 3 days ago.

## 2020-06-19 NOTE — ED Provider Notes (Signed)
[facility]-EMERGENCY DEPT Provider Note   CSN: [id number] Arrival date & time: 06/19/20  4098     History Chief Complaint  Patient presents with  . Abdominal Pain  . Nausea    Daisy Goodman is a 25 y.o. female.  HPI Patient is a 25 year old female with a medical history as noted below.  She presents the emergency department today due to intermittent pelvic cramping as well as intermittent nausea that started for the past 8 days.  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  They typically resolve during the day.  Upon arrival she states she was having some mild cramping in the suprapubic region which is since resolved.  She denies any symptoms at this time.  She states that earlier this week she had a 2-day menstrual cycle which stopped about 2 days ago.  This is atypical for her.  She typically has menstrual cycles that last about a week and occur normally every month.  Denies any vomiting.  She does report some loose stools but denies any current diarrhea.  No hematochezia.  She states she is been sexually active with 2 males in the last 6 months.  She states that she typically uses protection but did not use protection with one partner last month.  No dysuria, hematuria, vaginal discharge.    Past Medical History:  Diagnosis Date  . Asthma     Patient Active Problem List   Diagnosis Date Noted  . High fever 03/03/2019  . Elevated liver enzymes 03/03/2019  . Headache 03/03/2019  . Fever and chills 03/03/2019  . Fever 03/03/2019  . Rhinitis, allergic 02/26/2015  . Dysmenorrhea 02/26/2015  . Severe obesity (BMI >= 40) (HCC) 09/17/2013  . Asthma, chronic 09/17/2013    Past Surgical History:  Procedure Laterality Date  . IR FL GUIDED LOC OF NEEDLE/CATH TIP FOR SPINAL INJECTION RT  03/08/2019     OB History   No obstetric history on file.     Family History  Problem Relation Age of Onset  .  Hypertension Other   . Heart attack Other     Social History   Tobacco Use  . Smoking status: Passive Smoke Exposure - Never Smoker  . Smokeless tobacco: Never Used  Substance Use Topics  . Alcohol use: No  . Drug use: No    Home Medications Prior to Admission medications   Medication Sig Start Date End Date Taking? Authorizing Provider  lidocaine (XYLOCAINE) 2 % solution Use as directed 15 mLs in the mouth or throat as needed for mouth pain. 04/26/20   McDonald, Mia A, PA-C  norethindrone-ethinyl estradiol-iron (JUNEL FE 1.5/30) 1.5-30 MG-MCG tablet Take 1 tablet by mouth daily. Patient not taking: No sig reported 10/13/19 04/26/20  Verneda Skill, FNP    Allergies    Patient has no known allergies.  Review of Systems   Review of Systems  All other systems reviewed and are negative. Ten systems reviewed and are negative for acute change, except as noted in the HPI.   Physical Exam Updated Vital Signs BP (!) 135/56 (BP Location: Right Arm)   Pulse 85   Temp 98.5 F (36.9 C) (Oral)   Resp 18   LMP 06/16/2020 (Approximate)   SpO2 100%   Physical Exam Vitals and nursing note reviewed.  Constitutional:      General: She is not in acute distress.    Appearance: Normal appearance.  She is well-developed. She is not ill-appearing, toxic-appearing or diaphoretic.  HENT:     Head: Normocephalic and atraumatic.     Right Ear: External ear normal.     Left Ear: External ear normal.     Nose: Nose normal.     Mouth/Throat:     Mouth: Mucous membranes are moist.     Pharynx: Oropharynx is clear. No oropharyngeal exudate or posterior oropharyngeal erythema.  Eyes:     Extraocular Movements: Extraocular movements intact.  Cardiovascular:     Rate and Rhythm: Normal rate and regular rhythm.     Pulses: Normal pulses.     Heart sounds: Normal heart sounds. No murmur heard. No friction rub. No gallop.   Pulmonary:     Effort: Pulmonary effort is normal. No respiratory  distress.     Breath sounds: Normal breath sounds. No stridor. No wheezing, rhonchi or rales.  Abdominal:     General: Abdomen is flat.     Tenderness: There is abdominal tenderness in the suprapubic area and left lower quadrant.     Comments: Protuberant abdomen that is soft.  She has very mild tenderness in the left lower quadrant as well as the suprapubic region.  Musculoskeletal:        General: Normal range of motion.     Cervical back: Normal range of motion and neck supple. No tenderness.  Skin:    General: Skin is warm and dry.     Capillary Refill: Capillary refill takes less than 2 seconds.  Neurological:     General: No focal deficit present.     Mental Status: She is alert and oriented to person, place, and time.  Psychiatric:        Mood and Affect: Mood normal.        Behavior: Behavior normal.    ED Results / Procedures / Treatments   Labs (all labs ordered are listed, but only abnormal results are displayed) Labs Reviewed  COMPREHENSIVE METABOLIC PANEL - Abnormal; Notable for the following components:      Result Value   AST 14 (*)    All other components within normal limits  URINALYSIS, ROUTINE W REFLEX MICROSCOPIC - Abnormal; Notable for the following components:   APPearance HAZY (*)    Hgb urine dipstick LARGE (*)    Protein, ur 30 (*)    Leukocytes,Ua TRACE (*)    Bacteria, UA RARE (*)    All other components within normal limits  WET PREP, GENITAL  CBC WITH DIFFERENTIAL/PLATELET  I-STAT BETA HCG BLOOD, ED ([facility], [facility], [facility] ONLY)  GC/CHLAMYDIA PROBE AMP ([place]) NOT AT [facility]   EKG None  Radiology No results found.  Procedures Procedures   Medications Ordered in ED Medications - No data to display  ED Course  I have reviewed the triage vital signs and the nursing notes.  Pertinent labs & imaging results that were available during my care of the patient were reviewed by me and considered in my medical decision making (see chart for  details).    MDM Rules/Calculators/A&P                          Pt is a 25 y.o. female who presents the emergency department with intermittent lower abdominal cramping as well nausea.  Labs: CBC without abnormalities. CMP with an AST of 14. I-STAT beta-hCG less than 5. UA shows large hemoglobin, 30 protein, trace leukocytes, 21-50 RBCs, rare bacteria.  I, Placido Sou, PA-C, personally reviewed and evaluated these images and lab results as part of my medical decision-making.  Initial lab work was all reassuring.  Patient had very mild tenderness in the left lower quadrant on my exam.  No current nausea.  Discussed lab findings with the patient and recommended pelvic exam as well as wet prep and GC/chlamydia.  She was amenable with this plan.  Nursing staff went to set up the pelvic and notified me that patient had eloped.  They attempted to find the patient on the hospital campus but were unable to do so.  Note: Portions of this report may have been transcribed using voice recognition software. Every effort was made to ensure accuracy; however, inadvertent computerized transcription errors may be present.   Final Clinical Impression(s) / ED Diagnoses Final diagnoses:  Abdominal cramping    Rx / DC Orders ED Discharge Orders    None       Placido Sou, PA-C 06/19/20 1217    Benjiman Core, MD 06/19/20 [phone]

## 2020-06-19 NOTE — ED Notes (Signed)
Pt departed AMA. Logan, PA notified. Enrique Sack, NT visualized pt ambulatory upon departure. This RN noted pt in no apparent distress upon most recent assessment

## 2020-09-24 IMAGING — MR MR HEAD W/O CM
12 of 14 series · 34 of 48 positions shown · non-contrast
Comparison: None.

CLINICAL DATA: Headache and dizziness

EXAM:
MRI HEAD WITHOUT CONTRAST
MRA HEAD WITHOUT CONTRAST
TECHNIQUE: Multiplanar, multiecho pulse sequences of the brain and surrounding
structures were obtained without intravenous contrast. Angiographic
images of the head were obtained using MRA technique without
contrast.

[Series 5: DWI · axial · 3.0mm · 0.88mm/px · z∈[-131,+28]mm · 7 of 108 slices shown (1 of 4)]
[im 1/108]
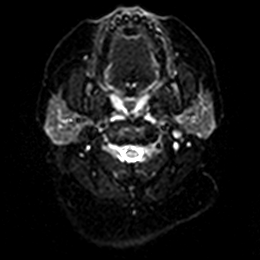
[im 18/108]
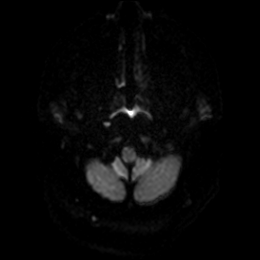
[im 36/108]
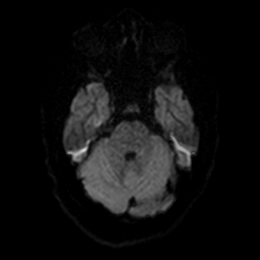
[im 54/108]
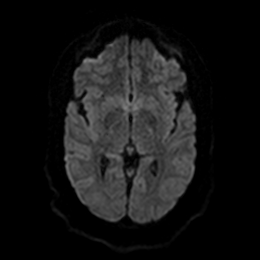
[im 72/108]
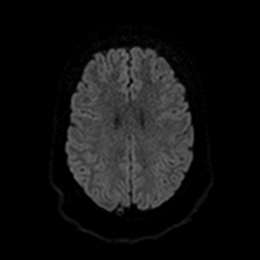
[im 90/108]
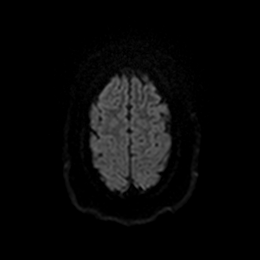
[im 108/108]
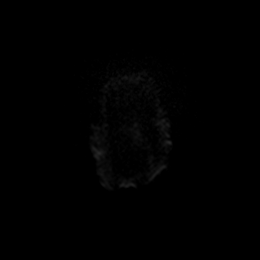

[Series 6: DWI · axial · 3.0mm · 0.88mm/px · z∈[-131,+28]mm · 4 of 54 slices shown (2 of 4)]
[im 1/54]
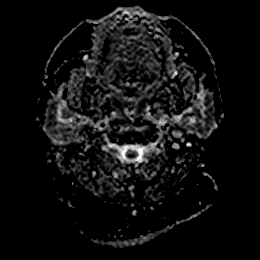
[im 18/54]
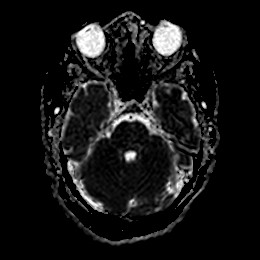
[im 36/54]
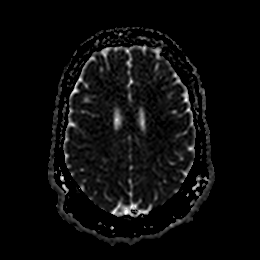
[im 54/54]
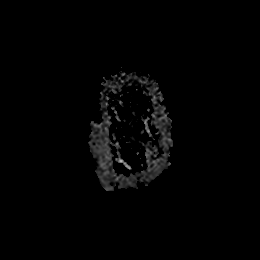

[Series 7: DWI · coronal · 4.0mm · 0.88mm/px · 4 of 72 slices shown (3 of 4)]
[im 1/72]
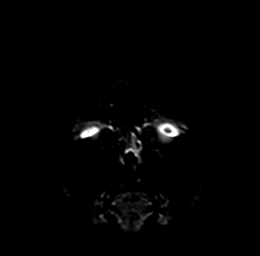
[im 24/72]
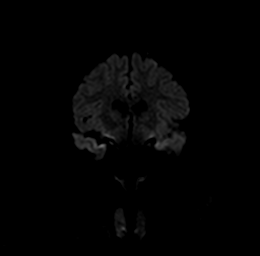
[im 48/72]
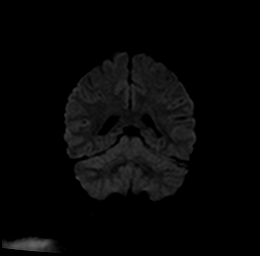
[im 72/72]
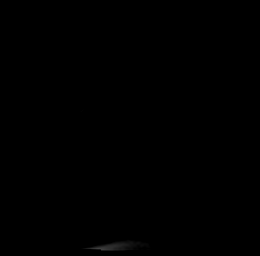

[Series 8: DWI · coronal · 4.0mm · 0.88mm/px · 2 of 36 slices shown (4 of 4)]
[im 1/36]
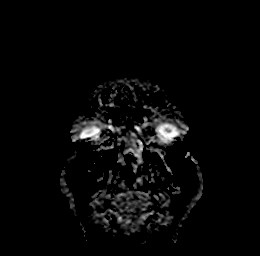
[im 36/36]
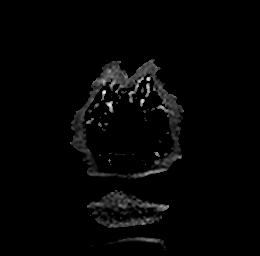

[Series 13: T1 · sagittal · 5.0mm · 0.75mm/px · 1 of 25 slices shown]
[im 1/25]
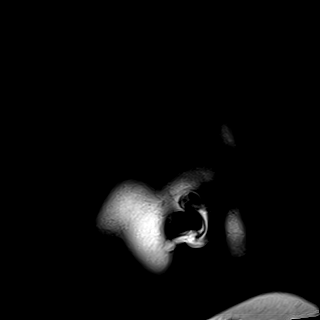

[Series 14: T2 · axial · 5.0mm · 0.72mm/px · 1 of 26 slices shown (1 of 2)]
[im 1/26]
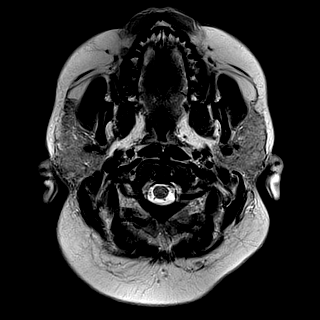

[Series 15: FLAIR · axial · 5.0mm · 0.45mm/px · 1 of 26 slices shown]
[im 1/26]
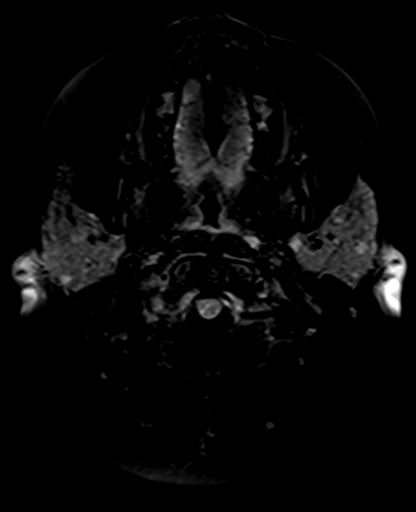

[Series 16: mag_images · axial · 3.0mm · 0.90mm/px · z∈[-134,+42]mm · 3 of 60 slices shown]
[im 1/60]
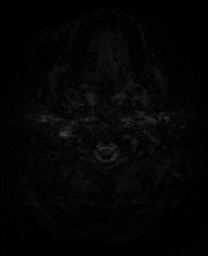
[im 30/60]
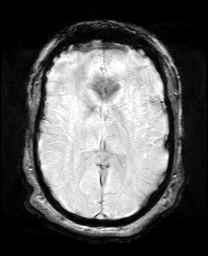
[im 60/60]
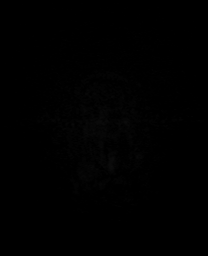

[Series 17: pha_images · axial · 3.0mm · 0.90mm/px · z∈[-134,+42]mm · 3 of 60 slices shown]
[im 1/60]
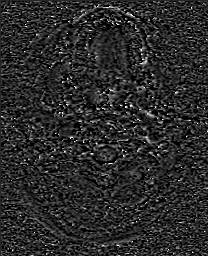
[im 30/60]
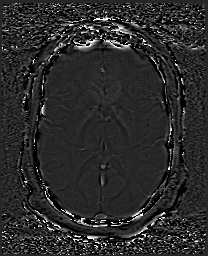
[im 60/60]
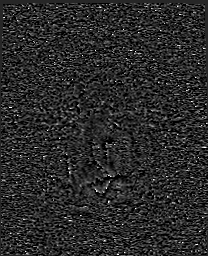

[Series 18: swi_images · axial · 3.0mm · 0.90mm/px · z∈[-134,+42]mm · 3 of 60 slices shown]
[im 1/60]
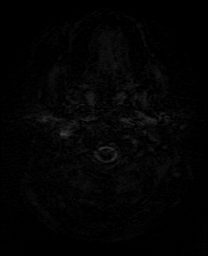
[im 30/60]
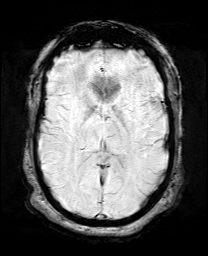
[im 60/60]
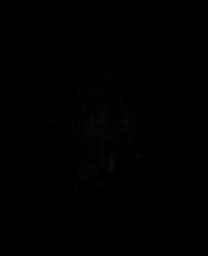

[Series 19: mip_images(sw) · axial · 24.0mm · 0.90mm/px · z∈[-124,+31]mm · 3 of 53 slices shown]
[im 1/53]
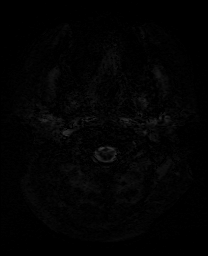
[im 27/53]
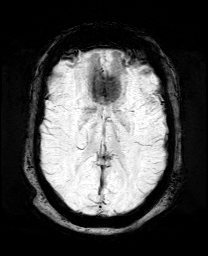
[im 53/53]
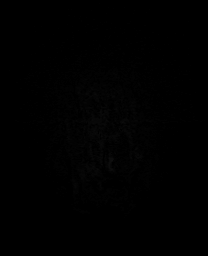

[Series 21: T2 · coronal · 5.0mm · 0.34mm/px · 2 of 29 slices shown (2 of 2)]
[im 1/29]
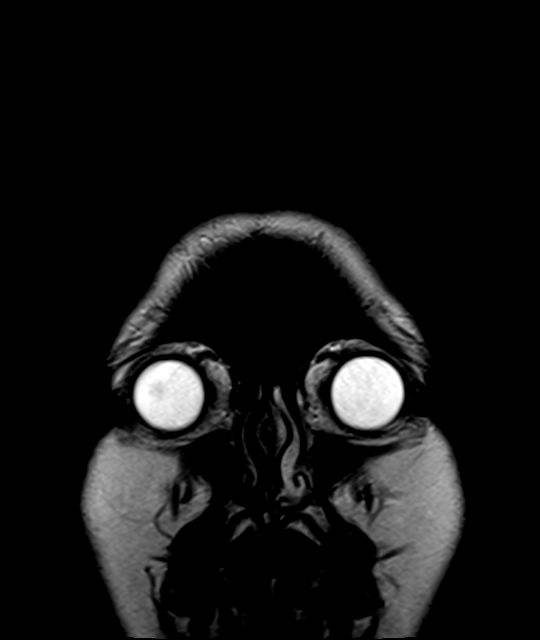
[im 29/29]
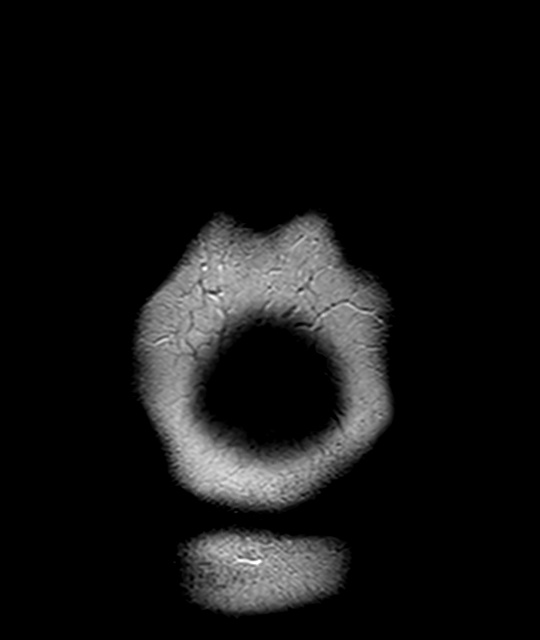

[34 of 48 positions shown; findings below may reference images not displayed]

FINDINGS: MRI HEAD FINDINGS

BRAIN: There is no acute infarct, acute hemorrhage or extra-axial
collection. The white matter signal is normal for the patient's age.
The cerebral and cerebellar volume are age-appropriate. There is no
hydrocephalus. Blood-sensitive sequences show no chronic
microhemorrhage or superficial siderosis. The midline structures are
normal.

SKULL AND UPPER CERVICAL SPINE: The visualized skull base,
calvarium, upper cervical spine and extracranial soft tissues are
normal.

SINUSES/ORBITS: No fluid levels or advanced mucosal thickening. No
mastoid or middle ear effusion. The orbits are normal.

MRA HEAD FINDINGS

POSTERIOR CIRCULATION:

--Basilar artery: Normal.

--Posterior cerebral arteries: Normal. Both originate from the
basilar artery.

--Superior cerebellar arteries: Normal.

--Inferior cerebellar arteries: Normal anterior and posterior
inferior cerebellar arteries.

ANTERIOR CIRCULATION:

--Intracranial internal carotid arteries: Normal.

--Anterior cerebral arteries: Normal. Both A1 segments are present.
Patent anterior communicating artery.

--Middle cerebral arteries: Normal.

--Posterior communicating arteries: Present bilaterally.
IMPRESSION: Normal MRI/MRA of the brain.

## 2020-09-24 IMAGING — MR MR MRV HEAD WO/W CM
1 series · 1 of 1 positions shown · IV contrast (agent unspecified)
Comparison: MRI and MRA head without contrast 03/07/2019

CLINICAL DATA: Headache and dizziness.

EXAM:
MR VENOGRAM HEAD WITHOUT AND WITH CONTRAST
TECHNIQUE: Angiographic images of the intracranial venous structures were
obtained using MRV technique without and with intravenous contrast.

[Series 1015: mpr 1mm range(2) · 0.72mm/px · 1 of 1 slices shown]
[im 1/1]
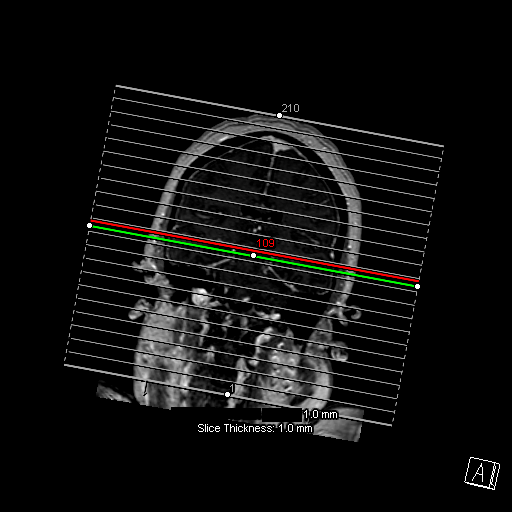

[1 of 1 positions shown; findings below may reference images not displayed]

FINDINGS: The dural sinuses are patent. The right transverse sinus is
dominant. Left sigmoid sinus is patent. The straight sinus deep
cerebral veins are intact. Cortical veins are normal. No thrombus is
present.
IMPRESSION: Negative MRV of the head.

## 2021-03-09 ENCOUNTER — Other Ambulatory Visit: Payer: Self-pay | Admitting: Obstetrics and Gynecology

## 2021-03-09 DIAGNOSIS — Z363 Encounter for antenatal screening for malformations: Secondary | ICD-10-CM

## 2021-03-23 ENCOUNTER — Encounter: Payer: Self-pay | Admitting: *Deleted

## 2021-03-30 ENCOUNTER — Ambulatory Visit: Payer: Medicaid Other | Attending: Obstetrics and Gynecology

## 2021-03-30 ENCOUNTER — Other Ambulatory Visit: Payer: Self-pay | Admitting: *Deleted

## 2021-03-30 ENCOUNTER — Encounter: Payer: Self-pay | Admitting: *Deleted

## 2021-03-30 ENCOUNTER — Ambulatory Visit: Payer: Medicaid Other | Admitting: *Deleted

## 2021-03-30 ENCOUNTER — Other Ambulatory Visit: Payer: Self-pay

## 2021-03-30 VITALS — BP 126/64 | HR 102

## 2021-03-30 DIAGNOSIS — Z363 Encounter for antenatal screening for malformations: Secondary | ICD-10-CM | POA: Diagnosis present

## 2021-03-30 DIAGNOSIS — Z6841 Body Mass Index (BMI) 40.0 and over, adult: Secondary | ICD-10-CM | POA: Diagnosis present

## 2021-04-27 ENCOUNTER — Ambulatory Visit: Payer: Medicaid Other | Attending: Obstetrics and Gynecology

## 2021-04-27 ENCOUNTER — Ambulatory Visit: Payer: Medicaid Other | Admitting: *Deleted

## 2021-04-27 ENCOUNTER — Other Ambulatory Visit: Payer: Self-pay | Admitting: *Deleted

## 2021-04-27 ENCOUNTER — Other Ambulatory Visit: Payer: Self-pay

## 2021-04-27 VITALS — BP 143/75 | HR 93

## 2021-04-27 DIAGNOSIS — O99212 Obesity complicating pregnancy, second trimester: Secondary | ICD-10-CM | POA: Diagnosis not present

## 2021-04-27 DIAGNOSIS — Z3689 Encounter for other specified antenatal screening: Secondary | ICD-10-CM

## 2021-04-27 DIAGNOSIS — Z3A23 23 weeks gestation of pregnancy: Secondary | ICD-10-CM | POA: Diagnosis not present

## 2021-04-27 DIAGNOSIS — Z6841 Body Mass Index (BMI) 40.0 and over, adult: Secondary | ICD-10-CM

## 2021-04-27 DIAGNOSIS — Z363 Encounter for antenatal screening for malformations: Secondary | ICD-10-CM | POA: Diagnosis not present

## 2021-04-27 DIAGNOSIS — E668 Other obesity: Secondary | ICD-10-CM

## 2021-05-27 ENCOUNTER — Other Ambulatory Visit: Payer: Self-pay

## 2021-05-27 ENCOUNTER — Ambulatory Visit: Payer: Medicaid Other | Admitting: *Deleted

## 2021-05-27 ENCOUNTER — Other Ambulatory Visit: Payer: Self-pay | Admitting: *Deleted

## 2021-05-27 ENCOUNTER — Ambulatory Visit: Payer: Medicaid Other | Attending: Obstetrics and Gynecology

## 2021-05-27 VITALS — BP 142/69 | HR 111

## 2021-05-27 DIAGNOSIS — Z6841 Body Mass Index (BMI) 40.0 and over, adult: Secondary | ICD-10-CM | POA: Diagnosis present

## 2021-05-27 DIAGNOSIS — Z3A27 27 weeks gestation of pregnancy: Secondary | ICD-10-CM | POA: Diagnosis not present

## 2021-05-27 DIAGNOSIS — E669 Obesity, unspecified: Secondary | ICD-10-CM | POA: Diagnosis not present

## 2021-05-27 DIAGNOSIS — Z3689 Encounter for other specified antenatal screening: Secondary | ICD-10-CM

## 2021-05-27 DIAGNOSIS — O99212 Obesity complicating pregnancy, second trimester: Secondary | ICD-10-CM | POA: Insufficient documentation
